# Patient Record
Sex: Male | Born: 1980
Health system: Southern US, Community
[De-identification: ages and names within clinical notes are randomized; demographics above are authoritative.]

## PROBLEM LIST (undated history)

## (undated) DIAGNOSIS — E7800 Pure hypercholesterolemia, unspecified: Secondary | ICD-10-CM

## (undated) DIAGNOSIS — D689 Coagulation defect, unspecified: Secondary | ICD-10-CM

## (undated) DIAGNOSIS — F1910 Other psychoactive substance abuse, uncomplicated: Secondary | ICD-10-CM

## (undated) DIAGNOSIS — D6851 Activated protein C resistance: Secondary | ICD-10-CM

## (undated) HISTORY — DX: Coagulation defect, unspecified: D68.9

## (undated) HISTORY — DX: Pure hypercholesterolemia, unspecified: E78.00

## (undated) HISTORY — DX: Other psychoactive substance abuse, uncomplicated: F19.10

## (undated) HISTORY — PX: MULTIPLE TOOTH EXTRACTIONS: SHX2053

---
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
## 2002-08-04 ENCOUNTER — Inpatient Hospital Stay (HOSPITAL_COMMUNITY): Admission: EM | Admit: 2002-08-04 | Discharge: 2002-08-10 | Payer: Self-pay | Admitting: Emergency Medicine

## 2002-08-08 ENCOUNTER — Encounter: Payer: Self-pay | Admitting: Endocrinology

## 2002-08-27 ENCOUNTER — Emergency Department (HOSPITAL_COMMUNITY): Admission: EM | Admit: 2002-08-27 | Discharge: 2002-08-27 | Payer: Self-pay | Admitting: *Deleted

## 2002-08-28 ENCOUNTER — Emergency Department (HOSPITAL_COMMUNITY): Admission: EM | Admit: 2002-08-28 | Discharge: 2002-08-28 | Payer: Self-pay | Admitting: *Deleted

## 2002-08-30 ENCOUNTER — Ambulatory Visit: Admission: RE | Admit: 2002-08-30 | Discharge: 2002-08-30 | Payer: Self-pay | Admitting: Oncology

## 2002-09-03 ENCOUNTER — Emergency Department (HOSPITAL_COMMUNITY): Admission: EM | Admit: 2002-09-03 | Discharge: 2002-09-03 | Payer: Self-pay | Admitting: Emergency Medicine

## 2002-09-04 ENCOUNTER — Emergency Department (HOSPITAL_COMMUNITY): Admission: EM | Admit: 2002-09-04 | Discharge: 2002-09-04 | Payer: Self-pay | Admitting: Emergency Medicine

## 2003-03-20 ENCOUNTER — Emergency Department (HOSPITAL_COMMUNITY): Admission: EM | Admit: 2003-03-20 | Discharge: 2003-03-20 | Payer: Self-pay | Admitting: Emergency Medicine

## 2003-04-03 ENCOUNTER — Emergency Department (HOSPITAL_COMMUNITY): Admission: EM | Admit: 2003-04-03 | Discharge: 2003-04-03 | Payer: Self-pay | Admitting: Emergency Medicine

## 2003-08-14 ENCOUNTER — Emergency Department (HOSPITAL_COMMUNITY): Admission: EM | Admit: 2003-08-14 | Discharge: 2003-08-15 | Payer: Self-pay

## 2003-09-14 ENCOUNTER — Emergency Department (HOSPITAL_COMMUNITY): Admission: EM | Admit: 2003-09-14 | Discharge: 2003-09-15 | Payer: Self-pay | Admitting: *Deleted

## 2003-09-14 ENCOUNTER — Encounter: Payer: Self-pay | Admitting: Emergency Medicine

## 2003-09-28 ENCOUNTER — Emergency Department (HOSPITAL_COMMUNITY): Admission: EM | Admit: 2003-09-28 | Discharge: 2003-09-28 | Payer: Self-pay | Admitting: Emergency Medicine

## 2005-05-18 ENCOUNTER — Inpatient Hospital Stay (HOSPITAL_COMMUNITY): Admission: EM | Admit: 2005-05-18 | Discharge: 2005-05-22 | Payer: Self-pay | Admitting: Emergency Medicine

## 2005-05-24 ENCOUNTER — Emergency Department (HOSPITAL_COMMUNITY): Admission: EM | Admit: 2005-05-24 | Discharge: 2005-05-24 | Payer: Self-pay | Admitting: Emergency Medicine

## 2005-05-26 ENCOUNTER — Ambulatory Visit: Payer: Self-pay | Admitting: *Deleted

## 2005-05-26 ENCOUNTER — Ambulatory Visit: Payer: Self-pay | Admitting: Family Medicine

## 2005-06-09 ENCOUNTER — Ambulatory Visit: Payer: Self-pay | Admitting: Oncology

## 2005-07-29 ENCOUNTER — Ambulatory Visit: Payer: Self-pay | Admitting: Oncology

## 2005-09-22 ENCOUNTER — Ambulatory Visit: Payer: Self-pay | Admitting: Oncology

## 2005-12-31 ENCOUNTER — Ambulatory Visit: Admission: RE | Admit: 2005-12-31 | Discharge: 2005-12-31 | Payer: Self-pay | Admitting: Oncology

## 2006-01-26 ENCOUNTER — Ambulatory Visit: Payer: Self-pay | Admitting: Oncology

## 2006-03-22 ENCOUNTER — Inpatient Hospital Stay (HOSPITAL_COMMUNITY): Admission: EM | Admit: 2006-03-22 | Discharge: 2006-03-23 | Payer: Self-pay | Admitting: *Deleted

## 2006-03-24 ENCOUNTER — Ambulatory Visit: Payer: Self-pay | Admitting: Oncology

## 2006-03-27 LAB — PROTIME-INR
INR: 2.1 (ref 2.00–3.50)
Protime: 17.9 Seconds — ABNORMAL HIGH (ref 10.6–13.4)

## 2006-03-30 LAB — PROTIME-INR
INR: 2.9 (ref 2.00–3.50)
Protime: 20.5 Seconds (ref 10.6–13.4)

## 2006-04-01 LAB — PROTIME-INR: Protime: 22.8 Seconds (ref 10.6–13.4)

## 2006-04-15 LAB — PROTIME-INR: INR: 4 (ref 2.00–3.50)

## 2006-04-22 LAB — PROTIME-INR

## 2006-05-07 ENCOUNTER — Ambulatory Visit (HOSPITAL_COMMUNITY): Admission: RE | Admit: 2006-05-07 | Discharge: 2006-05-07 | Payer: Self-pay | Admitting: Oncology

## 2006-05-07 LAB — PROTIME-INR
INR: 3.5 (ref 2.00–3.50)
Protime: 22.6 Seconds — ABNORMAL HIGH (ref 10.6–13.4)

## 2006-05-11 ENCOUNTER — Ambulatory Visit: Payer: Self-pay | Admitting: Oncology

## 2006-05-13 LAB — PROTIME-INR: Protime: 18.5 Seconds — ABNORMAL HIGH (ref 10.6–13.4)

## 2006-05-27 LAB — PROTIME-INR
INR: 1.8 — ABNORMAL LOW (ref 2.00–3.50)
Protime: 16.5 Seconds — ABNORMAL HIGH (ref 10.6–13.4)

## 2006-07-02 ENCOUNTER — Ambulatory Visit: Payer: Self-pay | Admitting: Oncology

## 2006-07-08 LAB — PROTIME-INR: Protime: 23 Seconds — ABNORMAL HIGH (ref 10.6–13.4)

## 2006-08-05 LAB — PROTIME-INR

## 2006-08-18 ENCOUNTER — Emergency Department (HOSPITAL_COMMUNITY): Admission: EM | Admit: 2006-08-18 | Discharge: 2006-08-19 | Payer: Self-pay | Admitting: Emergency Medicine

## 2006-08-31 ENCOUNTER — Ambulatory Visit: Payer: Self-pay | Admitting: Oncology

## 2006-09-02 LAB — PROTIME-INR: Protime: 30 Seconds — ABNORMAL HIGH (ref 10.6–13.4)

## 2006-10-26 ENCOUNTER — Ambulatory Visit: Payer: Self-pay | Admitting: Oncology

## 2006-12-20 ENCOUNTER — Ambulatory Visit: Payer: Self-pay | Admitting: Internal Medicine

## 2006-12-21 ENCOUNTER — Inpatient Hospital Stay (HOSPITAL_COMMUNITY): Admission: EM | Admit: 2006-12-21 | Discharge: 2006-12-22 | Payer: Self-pay | Admitting: Emergency Medicine

## 2006-12-25 ENCOUNTER — Ambulatory Visit: Payer: Self-pay | Admitting: Oncology

## 2007-01-16 ENCOUNTER — Emergency Department (HOSPITAL_COMMUNITY): Admission: EM | Admit: 2007-01-16 | Discharge: 2007-01-16 | Payer: Self-pay | Admitting: Emergency Medicine

## 2007-01-18 LAB — PROTIME-INR: Protime: 15.6 Seconds — ABNORMAL HIGH (ref 10.6–13.4)

## 2007-01-22 LAB — PROTIME-INR
INR: 1 — ABNORMAL LOW (ref 2.00–3.50)
Protime: 12 s (ref 10.6–13.4)

## 2007-02-16 ENCOUNTER — Ambulatory Visit: Payer: Self-pay | Admitting: Oncology

## 2007-02-22 LAB — PROTIME-INR
INR: 1.1 — ABNORMAL LOW (ref 2.00–3.50)
Protime: 13.2 Seconds (ref 10.6–13.4)

## 2007-04-13 ENCOUNTER — Ambulatory Visit: Payer: Self-pay | Admitting: Oncology

## 2007-06-02 ENCOUNTER — Ambulatory Visit: Payer: Self-pay | Admitting: Oncology

## 2007-06-02 LAB — PROTIME-INR

## 2007-07-13 ENCOUNTER — Ambulatory Visit: Payer: Self-pay | Admitting: Oncology

## 2007-09-27 ENCOUNTER — Ambulatory Visit: Payer: Self-pay | Admitting: Oncology

## 2007-11-14 ENCOUNTER — Ambulatory Visit: Payer: Self-pay | Admitting: Oncology

## 2008-03-16 ENCOUNTER — Ambulatory Visit: Payer: Self-pay | Admitting: Oncology

## 2008-04-18 LAB — PROTIME-INR: INR: 1.8 — ABNORMAL LOW (ref 2.00–3.50)

## 2008-05-10 ENCOUNTER — Ambulatory Visit: Payer: Self-pay | Admitting: Oncology

## 2008-07-06 ENCOUNTER — Ambulatory Visit: Payer: Self-pay | Admitting: Oncology

## 2008-08-21 ENCOUNTER — Ambulatory Visit: Payer: Self-pay | Admitting: Oncology

## 2008-08-21 LAB — PROTIME-INR
INR: 1.6 — ABNORMAL LOW (ref 2.00–3.50)
Protime: 19.2 Seconds — ABNORMAL HIGH (ref 10.6–13.4)

## 2008-10-10 ENCOUNTER — Ambulatory Visit: Payer: Self-pay | Admitting: Oncology

## 2008-10-12 LAB — PROTIME-INR
INR: 1.6 — ABNORMAL LOW (ref 2.00–3.50)
Protime: 19.2 Seconds — ABNORMAL HIGH (ref 10.6–13.4)

## 2008-11-28 ENCOUNTER — Ambulatory Visit: Payer: Self-pay | Admitting: Oncology

## 2008-11-28 LAB — PROTIME-INR: Protime: 18 Seconds — ABNORMAL HIGH (ref 10.6–13.4)

## 2009-02-19 ENCOUNTER — Ambulatory Visit: Payer: Self-pay | Admitting: Oncology

## 2009-02-19 LAB — PROTIME-INR: Protime: 45.6 Seconds — ABNORMAL HIGH (ref 10.6–13.4)

## 2009-05-04 ENCOUNTER — Ambulatory Visit: Payer: Self-pay | Admitting: Oncology

## 2009-06-07 LAB — CBC WITH DIFFERENTIAL/PLATELET
BASO%: 0.3 % (ref 0.0–2.0)
Basophils Absolute: 0 10*3/uL (ref 0.0–0.1)
HCT: 42.9 % (ref 38.4–49.9)
HGB: 14.8 g/dL (ref 13.0–17.1)
LYMPH%: 17.3 % (ref 14.0–49.0)
MCH: 31.2 pg (ref 27.2–33.4)
MCHC: 34.5 g/dL (ref 32.0–36.0)
MONO#: 0.6 10*3/uL (ref 0.1–0.9)
NEUT%: 72.9 % (ref 39.0–75.0)
Platelets: 248 10*3/uL (ref 140–400)
WBC: 7.7 10*3/uL (ref 4.0–10.3)

## 2009-06-07 LAB — PROTIME-INR

## 2009-06-18 ENCOUNTER — Ambulatory Visit: Payer: Self-pay | Admitting: Oncology

## 2009-06-18 LAB — PROTIME-INR
INR: 1 — ABNORMAL LOW (ref 2.00–3.50)
Protime: 12 Seconds (ref 10.6–13.4)

## 2009-07-03 LAB — PROTIME-INR: Protime: 22.8 Seconds — ABNORMAL HIGH (ref 10.6–13.4)

## 2009-07-18 ENCOUNTER — Ambulatory Visit: Payer: Self-pay | Admitting: Oncology

## 2010-02-25 ENCOUNTER — Ambulatory Visit: Payer: Self-pay | Admitting: Oncology

## 2010-02-25 LAB — PROTIME-INR: INR: 4.2 — ABNORMAL HIGH (ref 2.00–3.50)

## 2010-03-06 LAB — PROTIME-INR: INR: 1.9 — ABNORMAL LOW (ref 2.00–3.50)

## 2010-03-29 ENCOUNTER — Ambulatory Visit: Payer: Self-pay | Admitting: Oncology

## 2010-05-11 ENCOUNTER — Emergency Department (HOSPITAL_COMMUNITY): Admission: EM | Admit: 2010-05-11 | Discharge: 2010-05-11 | Payer: Self-pay | Admitting: Emergency Medicine

## 2010-07-16 ENCOUNTER — Ambulatory Visit: Payer: Self-pay | Admitting: Oncology

## 2010-09-29 ENCOUNTER — Ambulatory Visit: Payer: Self-pay | Admitting: Psychiatry

## 2010-09-29 ENCOUNTER — Emergency Department (HOSPITAL_COMMUNITY): Admission: EM | Admit: 2010-09-29 | Discharge: 2010-09-29 | Payer: Self-pay | Admitting: Emergency Medicine

## 2010-09-29 ENCOUNTER — Inpatient Hospital Stay (HOSPITAL_COMMUNITY): Admission: RE | Admit: 2010-09-29 | Discharge: 2010-10-04 | Payer: Self-pay | Admitting: Psychiatry

## 2010-10-07 ENCOUNTER — Ambulatory Visit: Payer: Self-pay | Admitting: Oncology

## 2010-10-07 LAB — PROTIME-INR: Protime: 26.4 Seconds — ABNORMAL HIGH (ref 10.6–13.4)

## 2011-01-12 ENCOUNTER — Encounter: Payer: Self-pay | Admitting: Specialist

## 2011-03-05 LAB — BASIC METABOLIC PANEL
BUN: 9 mg/dL (ref 6–23)
CO2: 29 mEq/L (ref 19–32)
GFR calc Af Amer: >60 mL/min (ref >60)
GFR calc non Af Amer: >60 mL/min (ref >60)
Potassium: 4 mEq/L (ref 3.5–5.1)
Sodium: 140 mEq/L (ref 135–145)

## 2011-03-05 LAB — RAPID URINE DRUG SCREEN, HOSP PERFORMED
Amphetamines: NOT DETECTED
Benzodiazepines: POSITIVE — AB
Opiates: POSITIVE — AB

## 2011-03-05 LAB — CBC
HCT: 44.2 % (ref 39.0–52.0)
MCHC: 34 g/dL (ref 30.0–36.0)
Platelets: 299 10*3/uL (ref 150–400)

## 2011-03-05 LAB — DIFFERENTIAL
Basophils Absolute: 0 10*3/uL (ref 0.0–0.1)
Lymphocytes Relative: 29 % (ref 12–46)
Monocytes Relative: 7 % (ref 3–12)
Neutrophils Relative %: 62 % (ref 43–77)

## 2011-03-06 LAB — PROTIME-INR
INR: 1.84 — ABNORMAL HIGH (ref 0.00–1.49)
INR: 2.17 — ABNORMAL HIGH (ref 0.00–1.49)
INR: 2.25 — ABNORMAL HIGH (ref 0.00–1.49)
Prothrombin Time: 22 seconds — ABNORMAL HIGH (ref 11.6–15.2)

## 2011-03-06 LAB — HIV ANTIBODY (ROUTINE TESTING W REFLEX): HIV: NONREACTIVE

## 2011-03-10 LAB — URINALYSIS, ROUTINE W REFLEX MICROSCOPIC
Bilirubin Urine: NEGATIVE
Nitrite: NEGATIVE
Specific Gravity, Urine: 1.014 (ref 1.005–1.030)
Urobilinogen, UA: 0.2 mg/dL (ref 0.0–1.0)
pH: 7 (ref 5.0–8.0)

## 2011-03-10 LAB — CBC
HCT: 40.4 % (ref 39.0–52.0)
MCV: 96.8 fL (ref 78.0–100.0)
WBC: 15.2 10*3/uL — ABNORMAL HIGH (ref 4.0–10.5)

## 2011-03-10 LAB — DIFFERENTIAL
Basophils Absolute: 0 10*3/uL (ref 0.0–0.1)
Eosinophils Absolute: 0.1 10*3/uL (ref 0.0–0.7)
Lymphs Abs: 1.4 10*3/uL (ref 0.7–4.0)
Monocytes Relative: 1 % — ABNORMAL LOW (ref 3–12)

## 2011-03-10 LAB — APTT: aPTT: 23 seconds — ABNORMAL LOW (ref 24–37)

## 2011-03-10 LAB — RAPID URINE DRUG SCREEN, HOSP PERFORMED: Barbiturates: NOT DETECTED

## 2011-03-10 LAB — PROTIME-INR: Prothrombin Time: 15.5 seconds — ABNORMAL HIGH (ref 11.6–15.2)

## 2011-05-09 NOTE — Discharge Summary (Signed)
Adam Landry, Adam Landry            ACCOUNT NO.:  [id number]   MEDICAL RECORD NO.:  [id number]          PATIENT TYPE:  INP   LOCATION:  5040                         FACILITY:  [facility]   PHYSICIAN:  C. Ulyess Mort, M.D.DATE OF BIRTH:  [birth year]   DATE OF ADMISSION:  12/20/2006  DATE OF DISCHARGE:  12/22/2006                               DISCHARGE SUMMARY   DISCHARGE DIAGNOSES:  1. Intentional Coumadin overdose.  2. Substance-induced mood disorder.  3. Alcohol abuse.  4. Cocaine abuse.  5. Tobacco abuse.  6. History of unprescribed Adderall abuse.  7. Factor V Leiden mutation.  8. History of deep venous thrombosis in 2003 and 2006.  9. History of pulmonary embolus in April 2007.   DISCHARGE MEDICATIONS:  Coumadin 10 mg p.o. daily, first dose on December 24, 2006.   DISPOSITION AND FOLLOWUP:  At the time of discharge, the patient was in  stable condition.  His INR was 3.8 and trending down.  He denied having  any suicidal or homicidal ideation and was felt to be safe for discharge  by psychiatry.  He was asked to contact Dr. Darnelle Catalan in order to resume  normal monitoring of his Coumadin.  He will also be contacted by the  [facility] in order to have a hospital followup  scheduled in the next 1-2 weeks.  Finally, he was provided with a card  for [facility] and advised to contact them for further  psychiatric and substance abuse treatment.   PROCEDURES PERFORMED:  None.   CONSULTATIONS:  Antonietta Breach, M.D., psychiatry.   BRIEF ADMITTING HISTORY AND PHYSICAL:  The patient is a 30 year old  gentleman with a history of multiple DVTs and PE secondary to a factor V  Leiden mutation, who is on chronic Coumadin therapy.  He was brought to  the emergency room by the [facility] Department for  disorderly conduct.  While in custody, he reported that he had taken an  unknown amount of Coumadin that evening.  The patient reports that, the  day prior to admission, he had been drinking quite heavily with friends.  Upon coming to his home, he took an unknown amount of Coumadin.  He is  unsure of exactly why he did this, but cannot exclude that it was an  attempt to hurt himself.  He denied having any active bleeding, although  he endorsed having one episode of melanotic stools several months ago,  as well as intermittent blood-streaked stools since that time.  He also  complained of transient chest pain which was not in any way similar to  his pain when he had his pulmonary embolus earlier this year.   PHYSICAL EXAMINATION:  VITAL SIGNS:  Temperature 98.6, blood pressure  131/72, pulse 74, respirations 20, oxygen saturation 98% on room air.  GENERAL:  The patient is a well-developed, well-nourished gentleman in  no acute distress.  HEENT:  Pupils are mildly constricted and sluggish, but otherwise  reactive to light and accommodation.  The patient has moist mucous  membranes without any oropharyngeal erythema or lesions.  RESPIRATORY:  Lungs are clear  to auscultation bilaterally.  CARDIOVASCULAR:  The patient has a regular rate and rhythm without  murmurs, rubs, or gallops.  ABDOMEN:  The patient's abdomen is soft, nontender, nondistended, with  normoactive bowel sounds.  RECTAL:  He has normal rectal tone and an empty vault.  EXTREMITIES:  No clubbing, cyanosis, or edema is noted.  The patient has  a sizable ecchymosis on his right hand where he said he had punched a  wall on the evening prior to admission.  SKIN:  No petechiae or purpura are noted.  His skin is otherwise without  rashes or lesions with the exception of the bruise on his right hand.  NEUROLOGIC:  Cranial nerves II-XII are intact.  Cerebellar function is  normal.  The patient has normal gait and balance.  He has 5/5 strength  in his upper and lower extremities bilaterally.  He has 2+ deep tendon  reflexes throughout.  He has normal fine touch sensation in his  upper  and lower extremities bilaterally.  PSYCH:  The patient is alert and oriented x3.  He denies having any  active suicidal or homicidal ideation.  He also denies auditory or  visual hallucinations; however, he endorses having a depressed mood,  anhedonia, disturbed sleep, decreased concentration, decreased energy,  occasional flight of ideas, and a history of decreased need for sleep  and reckless behavior in the past.   ADMISSION LABS:  White blood cell count 7.4, hemoglobin 14.9, platelets  239, ANC 4.5, MCV 95.3.  Sodium 142, potassium 4, chloride 112,  bicarbonate 20, BUN 16, creatinine 1.1, glucose 93, anion gap 10.  Total  bilirubin 0.5, alkaline phosphatase 52, AST 31, ALT 32, total protein  6.3, albumin 3.9, calcium 9.  PT 37.9, INR 3.6, PTT 60.  Serum  acetaminophen less than 10, serum salicylate less than 4, serum alcohol  136, urine drug screen positive for benzodiazepines, cocaine, and THC.   EKG:  Normal sinus rhythm with a rate of 83.  Intervals are all within  normal limits.  The patient has alternate criteria for LVH, with a QRS  amplitude greater than 10 mm in lead 1; otherwise, his EKG is normal  without any acute ST-segment changes.   HOSPITAL COURSE:  1. Coumadin overdose.  Because the patient had taken an unknown but      significant quantity of Coumadin on the day prior to admission, he      was admitted for monitoring.  His initial INR was already mildly      supratherapeutic at 3.6 and subsequently trended upwards, reaching      a maximum of 5.6 on the day after admission.  However, from there      it began trending downward and had reached a level of 3.8 by the      time of discharge.  He was instructed to restart his Coumadin 2      days after discharge on January 3.  Additionally, he was asked to      contact Dr. Darrall Dears office as soon as possible so that his INRs     can continue to be monitored and appropriate adjustments to his      Coumadin  made.  At no time during his hospitalization did the      patient have any active bleeding.  Additionally, his stools were      found to be fecal occult blood negative x2 and his hemoglobin      remained stable.  2. Substance-induced  mood disorder:  Based on the patient's drug      overdose and report of both depressed and manic mood over the      preceding weeks, he was admitted to the floor under suicide      precautions.  Dr. Jeanie Sewer of psychiatry evaluated the patient and      felt that his mental state was most consistent with a substance-      induced mood disorder.  He was not felt to be a threat to himself      or others and suicide precautions were discontinued.  The patient      stated at the time of admission that he had been taking Adderall      for several years, though he was never prescribed this.  He felt      that once he no longer had a supply of this, his mood became      depressed and he believed that he had attention deficit      hyperactivity disorder.  He thus requested that we provide him with      a prescription for Adderall, though we did not do this.  He was      given a card for [facility], where he can be      evaluated further and receive treatment for his psychiatric issues.  3. Polysubstance abuse:  As noted above, the patient was found to have      a urine drug screen positive for multiple substances.  He also      endorsed using these regularly for several years.  He was monitored      on the CIWA protocol and given thiamine and folate while in the      hospital.  At no time did he exhibit any signs or symptoms of acute      alcohol withdrawal.  He was provided information for alcohol and      drug services (ADS) and was strongly encouraged to obtain help for      his addictions.  4. Factor V Leiden mutation and recurrent DVTs and PE.  Because the      patient's INR was supratherapeutic and he had taken a large dose of      Coumadin,  further anticoagulation was not needed during this      hospitalization.  It was felt that it would be safe for the patient      to restart Coumadin 2 days after discharge based on his last INR.      It was stressed to him again that he follow up with Dr. Darnelle Catalan,      with whom he wished to continue dosing of his Coumadin, as soon as      possible.  A PT and INR will also be checked when he returns for      followup in the outpatient clinic in 1-2 weeks.   DISCHARGE LABS:  White blood cell count 7.2, hemoglobin 16.1, platelets  237,000.  PT 40, INR 3.8.  Sodium 138, potassium 3.8, chloride 107,  bicarbonate 24, BUN 16, creatinine 1, glucose 92, calcium 9.4.  HIV  negative.  Fecal occult blood negative x2.   DISCHARGE VITALS:  Temperature 99, blood pressure 129/85, pulse 71,  respirations 16, oxygen saturation 99% on room air.      Yvonne Kendall, M.D.  Electronically Signed     C. Ulyess Mort, M.D.  Electronically Signed    CE/MEDQ  D:  12/23/2006  T:  12/23/2006  Job:  [id number]   cc:   Dr. Lilian Kapur, M.D.

## 2011-05-09 NOTE — Consult Note (Signed)
NAMEMarland Kitchen  Adam Landry, Adam Landry NO.:  [id number]   MEDICAL RECORD NO.:  [id number]           PATIENT TYPE:   LOCATION:                                 FACILITY:   PHYSICIAN:  Antonietta Breach, M.D.  DATE OF BIRTH:  07-26-1981   DATE OF CONSULTATION:  12/21/2006  DATE OF DISCHARGE:                                 CONSULTATION   REQUESTING PHYSICIAN:  Ulyess Mort, M.D.   REASON FOR CONSULTATION:  Overdose.   HISTORY OF PRESENT ILLNESS:  Adam Landry is a 30 year old male admitted  to the [facility] on December 20, 2006 after a Coumadin overdose.   The patient does have a factor V Leiden mutation and is on chronic  Coumadin therapy.  He came to the emergency room under sheriff custody  for disorderly conduct. The patient was highly intoxicated on alcohol.  He did take an overdose of Coumadin.   He now reports that he has no suicidal thoughts or thoughts of harming  others.  He has no hallucinations or delusions.  His mood and interests  are within normal limits.  He has no hopelessness.  He does acknowledge  a severely excessive drinking of alcohol.   PAST PSYCHIATRIC HISTORY:  No history of mania.  No history of major  depression.  No history of suicide attempts.   FAMILY PSYCHIATRIC HISTORY:  None.   SOCIAL HISTORY:  The patient is single.  He has no children.  He does  have a history of alcohol abuse.  He has used cocaine in the past.  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  [employer].   PAST MEDICAL HISTORY:  As above. Her factor V Leiden mutation, history  of deep venous thrombosis in 2003 and 2006, history of pulmonary embolus  April 2007.   MEDICATIONS:  The MAR is reviewed.  The patient is not on any  psychotropic medication.   LABORATORY DATA:  WBC 6.5, hemoglobin 15.8, platelet count 241.   Comprehensive metabolic panel is unremarkable.  The SGOT is normal at  31, SGPT is normal at 32.  Tylenol was negative.   Alcohol was 136 still  after arriving at the emergency room.  Aspirin was negative.   REVIEW OF SYSTEMS:  CONSTITUTIONAL:  Afebrile.  No weight loss.  HEAD:  No trauma.  EYES:  No visual changes.  EARS:  No hearing impairment  NOSE:  No rhinorrhea.  THROAT:  No sore throat.  NEUROLOGIC:  Unremarkable.  PSYCHIATRIC:  As above. CARDIOVASCULAR:  No chest pain,  palpitations.  RESPIRATORY:  No coughing or wheezing.  GASTROINTESTINAL:  Unremarkable.  GENITOURINARY:  Unremarkable.  SKIN:  Unremarkable in the  interview.  METABOLIC:  Unremarkable.  MUSCULOSKELETAL:  No deformities.  HEMATOLOGIC LYMPHATIC:  As above.   PHYSICAL EXAMINATION:  VITAL SIGNS:  Temperature 97, pulse 68,  respiration 20, blood pressure 125/76, O2 saturation on room air 96%.  GENERAL APPEARANCE:  The patient is a young male appearing his  chronologic age sitting in his hospital bed in no apparent distress.  His eye contact is good.  He does not have any abnormal involuntary  movements or abnormal habitus.  He is well-groomed.   The patient's attention span is within normal limits.  His concentration  is normal.  He is oriented to all spheres.  His memory is intact to  immediate, recent and remote.  Fund of knowledge and intelligence are  within normal limits.  His speech involves normal rate and prosody.  There is no dysarthria.   Thought process is logical, coherent, goal-directed.  No looseness of  associations, calculation and abstraction ability are within normal  limits.  Thought content; no thoughts of harming himself.  No thoughts  of harming others. No delusions, no hallucinations. Insight is partial.  Judgment is intact.   ASSESSMENT:  AXIS I:  Rule out polysubstance dependence.          Adjustment disorder with mixed disturbance of emotions and  conduct.  AXIS II:  Deferred.  AXIS III:  See general medical problems.  AXIS IV:  General medical, legal.  AXIS V:  55.   The patient is no longer at risk to  harm himself or others after  recovering from his intoxicated state.   He agrees to call emergency services immediately for any thoughts of  harming himself, thoughts of harming others or distress.   The undersigned did recommend residential chemical dependency  rehabilitation in an inpatient substance rehab tract however, the  patient declined and he is no longer committable after recovering from  his intoxicated state.   The patient will attend outpatient ADS and 12-step groups.      Antonietta Breach, M.D.  Electronically Signed     JW/MEDQ  D:  06/28/2007  T:  06/29/2007  Job:  [id number]

## 2011-05-09 NOTE — H&P (Signed)
NAMEEMONI, YANG NO.:  [id number]   MEDICAL RECORD NO.:  [id number]          PATIENT TYPE:  INP   LOCATION:  1411                         FACILITY:  [facility]   PHYSICIAN:  Lonia Blood, M.D.       DATE OF BIRTH:  [birth year]   DATE OF ADMISSION:  03/22/2006  DATE OF DISCHARGE:                                HISTORY & PHYSICAL   CHIEF COMPLAINT:  Chest pain.   HISTORY OF PRESENT ILLNESS:  Mr. Evetts is a 30 year old man with history  of recurrent DVT's, factor 5 Leiden positive, who presented to [facility] emergency room this morning with new onset of severe right  sided chest pain. He denies any sweating, radiation, or shortness of breath.  He also denies any recent lower extremity swelling.   PAST MEDICAL HISTORY:  Right deep vein thrombosis in 2003, left deep vein  thrombosis in 2006. Also factor 5 Leiden positive. The patient used to take  Coumadin but he stopped taking Coumadin about 6 months ago.   FAMILY HISTORY:  Mother alive with hx of superficial venous thrombosis.  Father alive and healthy. Sister alive and healthy.   SOCIAL HISTORY:  The patient smokes a pack of cigarettes a day. Drinks beer.  Rarely uses alcohol. He says that he used to go to [employer] but now he  is making too much money to go there anymore.   ALLERGIES:  NO KNOWN DRUG ALLERGIES.   MEDICATIONS:  None.   REVIEW OF SYSTEMS:  All systems are negative.   PHYSICAL EXAMINATION:  VITAL SIGNS:  Temperature 98.7, pulse 88, respiratory  rate 24, blood pressure 133/80. Saturation 98% on room air.  GENERAL:  A well developed, well nourished man in no acute distress, sitting  on the stretcher.  HEENT:  Pupils are equal, round, and reactive to light and accommodation.  Extraocular muscles intact. Head is normocephalic, atraumatic.  NECK:  Supple. Without JVD. No carotid bruits.  CHEST:  Clear to auscultation bilaterally without wheezes, rhonchi, or  crackles.  HEART:  Regular rate and rhythm. No murmur, rub, or gallop.  ABDOMEN:  Soft, nontender, and nondistended. Bowel sounds present.  EXTREMITIES:  Have +1 bilateral edema, right greater than left. Per the  patient, this is chronic in nature.  SKIN:  Warm and dry without any suspicious rashes.  NEUROLOGIC:  Examination non-focal.   LABORATORY DATA:  On admission sodium 137, potassium 4.2, chloride 107,  bicarb 22, BUN 18, creatinine 1, glucose 124. Liver function studies are  within normal limits. White blood cell count 13.1. Hemoglobin 15.2. Platelet  count is 213,000.   CT scan of the chest shows a solitary right middle lobe branch pulmonary  artery embolus.   ASSESSMENT/PLAN:  1.  Pulmonary embolism. Most likely cause is the hypercoagulable statue due      to the genetic defect. Mr. Kolbeck will be started on subcutaneous      Lovenox and Coumadin. He will be educated about the importance of taking      Coumadin for the rest of his life. He will also be educated  about self-      administering the Lovenox and following up at the [facility] for      daily PT and INR's.  2.  Tobacco abuse. Mr. Monette will have counseling and he will be offered      the nicotine patch.      Lonia Blood, M.D.  Electronically Signed     SL/MEDQ  D:  03/22/2006  T:  03/23/2006  Job:  [id number]

## 2011-10-06 ENCOUNTER — Other Ambulatory Visit: Payer: Self-pay | Admitting: Oncology

## 2011-10-06 ENCOUNTER — Encounter (HOSPITAL_BASED_OUTPATIENT_CLINIC_OR_DEPARTMENT_OTHER): Payer: Self-pay | Admitting: Oncology

## 2011-10-06 DIAGNOSIS — Z86718 Personal history of other venous thrombosis and embolism: Secondary | ICD-10-CM

## 2011-10-06 DIAGNOSIS — Z5181 Encounter for therapeutic drug level monitoring: Secondary | ICD-10-CM

## 2011-10-06 DIAGNOSIS — Z7901 Long term (current) use of anticoagulants: Secondary | ICD-10-CM

## 2011-10-06 LAB — PROTIME-INR

## 2011-11-17 ENCOUNTER — Encounter: Payer: Self-pay | Admitting: *Deleted

## 2011-11-17 ENCOUNTER — Other Ambulatory Visit: Payer: Self-pay | Admitting: *Deleted

## 2011-11-17 DIAGNOSIS — D689 Coagulation defect, unspecified: Secondary | ICD-10-CM

## 2011-11-17 MED ORDER — WARFARIN SODIUM 5 MG PO TABS: 5.0000 mg | ORAL_TABLET | ORAL | Status: DC

## 2011-12-15 ENCOUNTER — Other Ambulatory Visit: Payer: Self-pay | Admitting: *Deleted

## 2011-12-15 DIAGNOSIS — D689 Coagulation defect, unspecified: Secondary | ICD-10-CM

## 2011-12-15 MED ORDER — WARFARIN SODIUM 5 MG PO TABS: ORAL_TABLET | ORAL | Status: DC

## 2012-02-02 ENCOUNTER — Encounter: Payer: Self-pay | Admitting: *Deleted

## 2012-02-02 NOTE — Progress Notes (Signed)
Refill authorization request for coumadin received via [employer] e[employer] for coumadin.  Request denied with reason patient needs to contact prescriber office.  Last PT/INR was Oct. 15, 2012 with no further appointments.

## 2012-02-11 NOTE — Progress Notes (Signed)
REFILL REQUEST RECEIVED VIA [employer] DENIED WITH REASON PATIENT NEEDS TO CONTACT PRESCRIBER.

## 2012-02-13 ENCOUNTER — Other Ambulatory Visit: Payer: Self-pay | Admitting: *Deleted

## 2012-02-13 NOTE — Telephone Encounter (Signed)
Pt called to this RN to state the pharmacy has not received refill ok for his coumadin. He is out of medication at this time.  This RN informed Rocky Link need to obtain lab on regular basis per his diagnosis and dosing of coumadin.  Note Rocky Link is not driving at this time.  Plan at present is for refill x 1 of coumadin which pt will resume today.  Lab will be drawn Thursday 2/28 per transportation arrangements.

## 2012-02-23 ENCOUNTER — Other Ambulatory Visit (HOSPITAL_BASED_OUTPATIENT_CLINIC_OR_DEPARTMENT_OTHER): Payer: Self-pay | Admitting: Lab

## 2012-02-23 ENCOUNTER — Other Ambulatory Visit: Payer: Self-pay | Admitting: *Deleted

## 2012-02-23 DIAGNOSIS — D6859 Other primary thrombophilia: Secondary | ICD-10-CM

## 2012-02-23 LAB — PROTIME-INR: INR: 1.6 — ABNORMAL LOW (ref 2.00–3.50)

## 2012-03-11 ENCOUNTER — Other Ambulatory Visit: Payer: Self-pay | Admitting: *Deleted

## 2012-03-11 DIAGNOSIS — D689 Coagulation defect, unspecified: Secondary | ICD-10-CM

## 2012-03-11 MED ORDER — WARFARIN SODIUM 5 MG PO TABS: ORAL_TABLET | ORAL | Status: DC

## 2012-04-12 ENCOUNTER — Other Ambulatory Visit: Payer: Self-pay | Admitting: *Deleted

## 2012-04-12 DIAGNOSIS — D689 Coagulation defect, unspecified: Secondary | ICD-10-CM

## 2012-04-12 MED ORDER — WARFARIN SODIUM 5 MG PO TABS: ORAL_TABLET | ORAL | Status: DC

## 2012-04-23 ENCOUNTER — Other Ambulatory Visit: Payer: Self-pay | Admitting: *Deleted

## 2012-04-23 DIAGNOSIS — D689 Coagulation defect, unspecified: Secondary | ICD-10-CM

## 2012-04-23 MED ORDER — WARFARIN SODIUM 5 MG PO TABS: ORAL_TABLET | ORAL | Status: DC

## 2012-05-28 ENCOUNTER — Other Ambulatory Visit: Payer: Self-pay | Admitting: *Deleted

## 2012-05-28 DIAGNOSIS — D6859 Other primary thrombophilia: Secondary | ICD-10-CM

## 2012-06-03 ENCOUNTER — Other Ambulatory Visit (HOSPITAL_BASED_OUTPATIENT_CLINIC_OR_DEPARTMENT_OTHER): Payer: Self-pay | Admitting: Lab

## 2012-06-03 DIAGNOSIS — Z86718 Personal history of other venous thrombosis and embolism: Secondary | ICD-10-CM

## 2012-06-03 DIAGNOSIS — Z5181 Encounter for therapeutic drug level monitoring: Secondary | ICD-10-CM

## 2012-06-03 DIAGNOSIS — Z7901 Long term (current) use of anticoagulants: Secondary | ICD-10-CM

## 2012-06-03 DIAGNOSIS — D6859 Other primary thrombophilia: Secondary | ICD-10-CM

## 2012-06-03 LAB — CBC WITH DIFFERENTIAL/PLATELET
Basophils Absolute: 0 10*3/uL (ref 0.0–0.1)
Eosinophils Absolute: 0.2 10*3/uL (ref 0.0–0.5)
HGB: 13.9 g/dL (ref 13.0–17.1)
MCV: 88.6 fL (ref 79.3–98.0)
MONO%: 9.7 % (ref 0.0–14.0)
NEUT#: 4.4 10*3/uL (ref 1.5–6.5)
Platelets: 202 10*3/uL (ref 140–400)
RDW: 13.2 % (ref 11.0–14.6)

## 2012-06-03 LAB — PROTIME-INR: Protime: 19.2 Seconds — ABNORMAL HIGH (ref 10.6–13.4)

## 2012-06-25 ENCOUNTER — Other Ambulatory Visit: Payer: Self-pay | Admitting: *Deleted

## 2012-06-25 DIAGNOSIS — D6859 Other primary thrombophilia: Secondary | ICD-10-CM

## 2012-06-28 ENCOUNTER — Other Ambulatory Visit (HOSPITAL_BASED_OUTPATIENT_CLINIC_OR_DEPARTMENT_OTHER): Payer: Self-pay | Admitting: Lab

## 2012-06-28 DIAGNOSIS — D6859 Other primary thrombophilia: Secondary | ICD-10-CM

## 2012-06-28 LAB — PROTIME-INR
INR: 1.1 — ABNORMAL LOW (ref 2.00–3.50)
Protime: 13.2 Seconds (ref 10.6–13.4)

## 2012-07-01 ENCOUNTER — Telehealth: Payer: Self-pay

## 2012-07-01 NOTE — Telephone Encounter (Signed)
Called pt and left message for pt to call office back.  Need to verify his coumadin dose, per Dr. Darnelle Catalan- if pt has not been taking 5 mg regularly, he should continue his current dose (should be 5 mg daily) and recheck in 1 week.  If he has been taking it regularly, he should increase to 10 mg and recheck in 2 weeks.

## 2012-09-05 ENCOUNTER — Other Ambulatory Visit: Payer: Self-pay | Admitting: Oncology

## 2012-09-06 ENCOUNTER — Other Ambulatory Visit: Payer: Self-pay | Admitting: Oncology

## 2012-09-06 DIAGNOSIS — D689 Coagulation defect, unspecified: Secondary | ICD-10-CM

## 2012-09-10 ENCOUNTER — Other Ambulatory Visit: Payer: Self-pay | Admitting: Lab

## 2012-09-23 ENCOUNTER — Other Ambulatory Visit: Payer: Self-pay | Admitting: Oncology

## 2012-09-23 DIAGNOSIS — D6859 Other primary thrombophilia: Secondary | ICD-10-CM

## 2012-09-27 ENCOUNTER — Other Ambulatory Visit: Payer: Self-pay | Admitting: *Deleted

## 2012-10-08 ENCOUNTER — Other Ambulatory Visit: Payer: Self-pay | Admitting: Lab

## 2012-10-08 ENCOUNTER — Ambulatory Visit: Payer: Self-pay | Admitting: Physician Assistant

## 2012-10-08 NOTE — Progress Notes (Signed)
[facility] today.  Letter mailed to patient.  

## 2012-11-04 ENCOUNTER — Other Ambulatory Visit (HOSPITAL_BASED_OUTPATIENT_CLINIC_OR_DEPARTMENT_OTHER): Payer: Self-pay | Admitting: Lab

## 2012-11-04 DIAGNOSIS — D689 Coagulation defect, unspecified: Secondary | ICD-10-CM

## 2012-11-04 LAB — CBC WITH DIFFERENTIAL/PLATELET
BASO%: 0.4 % (ref 0.0–2.0)
EOS%: 4.5 % (ref 0.0–7.0)
Eosinophils Absolute: 0.4 10*3/uL (ref 0.0–0.5)
MCH: 32.1 pg (ref 27.2–33.4)
MCHC: 35.2 g/dL (ref 32.0–36.0)
MCV: 91.1 fL (ref 79.3–98.0)
MONO%: 9.6 % (ref 0.0–14.0)
NEUT#: 5.3 10*3/uL (ref 1.5–6.5)
RBC: 5.05 10*6/uL (ref 4.20–5.82)
RDW: 13.3 % (ref 11.0–14.6)
nRBC: 0 % (ref 0–0)

## 2012-11-04 LAB — PROTIME-INR: Protime: 15.6 Seconds — ABNORMAL HIGH (ref 10.6–13.4)

## 2012-11-05 ENCOUNTER — Other Ambulatory Visit: Payer: Self-pay | Admitting: Lab

## 2012-11-25 ENCOUNTER — Other Ambulatory Visit: Payer: Self-pay | Admitting: Lab

## 2012-12-03 ENCOUNTER — Other Ambulatory Visit: Payer: Self-pay | Admitting: Lab

## 2012-12-31 ENCOUNTER — Other Ambulatory Visit: Payer: Self-pay

## 2013-01-04 ENCOUNTER — Other Ambulatory Visit: Payer: Self-pay | Admitting: Oncology

## 2013-01-04 DIAGNOSIS — D6859 Other primary thrombophilia: Secondary | ICD-10-CM

## 2013-01-05 ENCOUNTER — Encounter: Payer: Self-pay | Admitting: Pharmacist

## 2013-01-05 ENCOUNTER — Other Ambulatory Visit: Payer: Self-pay | Admitting: *Deleted

## 2013-01-05 DIAGNOSIS — D6859 Other primary thrombophilia: Secondary | ICD-10-CM

## 2013-01-05 NOTE — Progress Notes (Signed)
New consult to [facility] Coumadin clinic by Dr. Darnelle Catalan Indication: Factor V Leiden w/ h/o recurrent DVT Goal INR = 2-3.5 Length of anticoag: indefinite Risk Factors: non-compliant.  Drinks EtOH.   Current dose: 5 mg/day Needs to be seen w/in 1 month.  Have sched 1st [facility] appt for 01/28/13 (lab already sched).  I called pt to inform him he needs to stay after lab that day to be seen in [facility]. Marily Lente, Pharm.D.

## 2013-01-28 ENCOUNTER — Other Ambulatory Visit (HOSPITAL_BASED_OUTPATIENT_CLINIC_OR_DEPARTMENT_OTHER): Payer: Self-pay

## 2013-01-28 ENCOUNTER — Ambulatory Visit (HOSPITAL_BASED_OUTPATIENT_CLINIC_OR_DEPARTMENT_OTHER): Payer: Self-pay | Admitting: Pharmacist

## 2013-01-28 DIAGNOSIS — I82409 Acute embolism and thrombosis of unspecified deep veins of unspecified lower extremity: Secondary | ICD-10-CM

## 2013-01-28 DIAGNOSIS — D6851 Activated protein C resistance: Secondary | ICD-10-CM | POA: Insufficient documentation

## 2013-01-28 DIAGNOSIS — D6859 Other primary thrombophilia: Secondary | ICD-10-CM

## 2013-01-28 DIAGNOSIS — D689 Coagulation defect, unspecified: Secondary | ICD-10-CM

## 2013-01-28 HISTORY — DX: Acute embolism and thrombosis of unspecified deep veins of unspecified lower extremity: I82.409

## 2013-01-28 LAB — CBC WITH DIFFERENTIAL/PLATELET
Eosinophils Absolute: 0.2 10*3/uL (ref 0.0–0.5)
HCT: 45.9 % (ref 38.4–49.9)
LYMPH%: 21.6 % (ref 14.0–49.0)
MONO#: 0.6 10*3/uL (ref 0.1–0.9)
NEUT#: 4.9 10*3/uL (ref 1.5–6.5)
NEUT%: 67.1 % (ref 39.0–75.0)
Platelets: 243 10*3/uL (ref 140–400)
WBC: 7.3 10*3/uL (ref 4.0–10.3)
lymph#: 1.6 10*3/uL (ref 0.9–3.3)

## 2013-01-28 LAB — PROTIME-INR: Protime: 12 Seconds (ref 10.6–13.4)

## 2013-01-28 LAB — POCT INR: INR: 1

## 2013-01-28 MED ORDER — WARFARIN SODIUM 5 MG PO TABS: 10.0000 mg | ORAL_TABLET | Freq: Every day | ORAL | Status: DC

## 2013-01-28 NOTE — Progress Notes (Signed)
Pt presents to Coumadin Clinic for his 1st visit INR = 1 Pt reports that he went out of town over the weekend & forgot to take his Warfarin.  He restarted his Warfarin 10 mg/day on Wed (2/5) & he took his dose this AM.  He takes his dose in the AM. Pt states that when he travels, he often forgets his Warfarin & substitutes ASA.  I reminded him that the two are not equal agents.  He expressed understanding. He was remodeling a store over the weekend & he is sore this week.  Subsequently, he has used Ibuprofen 400 mg PO daily for pain. His diet consists of 7-8 servings of vit K foods each week.  He eats 1-2 tossed salads/week. He drinks a 6 pack of beer each week.  He seldom drinks enough EtOH to get "falling-down-drunk".  I reminded him that EtOH can increase INR.  He should never have multiple drinks over a short time span to the point of intoxication while on anticoagulation. Pt admits that he is non-compliant w/ his dosing of Warfarin.  When I reviewed the risk of stopping anticoagulation in his case, he expressed understanding & stated that he has "learned from his past mistakes." A full discussion of the nature of anticoagulants has been carried out.  A benefit risk analysis has been presented to the patient, so that he understands the justification for choosing anticoagulation at this time. The need for frequent and regular monitoring, precise dosage adjustment and compliance is stressed.  Side effects of potential bleeding are discussed.  The patient should avoid any OTC items containing aspirin or ibuprofen, and should avoid great swings in general diet.  Pt tells me that the dose of Coumadin 10 mg/day has maintained his INR at goal in the past so I will restart that dose & have him return in 2 weeks for INR check. I refilled his Warfarin w/ no refills so he will hopefully return for Coumadin clinic visits. Marily Lente, Pharm.D.

## 2013-02-10 ENCOUNTER — Other Ambulatory Visit: Payer: Self-pay

## 2013-02-10 ENCOUNTER — Ambulatory Visit (HOSPITAL_BASED_OUTPATIENT_CLINIC_OR_DEPARTMENT_OTHER): Payer: Self-pay | Admitting: Pharmacist

## 2013-02-10 DIAGNOSIS — D6851 Activated protein C resistance: Secondary | ICD-10-CM

## 2013-02-10 DIAGNOSIS — D6859 Other primary thrombophilia: Secondary | ICD-10-CM

## 2013-02-10 DIAGNOSIS — I82409 Acute embolism and thrombosis of unspecified deep veins of unspecified lower extremity: Secondary | ICD-10-CM

## 2013-02-10 LAB — POCT INR: INR: 3.1

## 2013-02-10 LAB — PROTIME-INR: Protime: 37.2 Seconds — ABNORMAL HIGH (ref 10.6–13.4)

## 2013-02-10 NOTE — Patient Instructions (Addendum)
Continue coumadin 10mg  daily. Recheck INR with your next scheduled lab appointment on 37/14; 10am for lab and 10:15am for coumadin clinic.  Remember that Ibuprofen can increase your INR and bleeding risk. If you continue Ibuprofen on a frequent basis for an extended length of time, you may irritate your stomach. It is important to take this medication with food.  Monitor for excessive bruising or any bleeding.

## 2013-02-10 NOTE — Progress Notes (Signed)
INR within goal today on 10mg  daily. Pt stated that he has been taking Ibuprofen for "quite some time" to help with the pain of an old shoulder injury. 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 We discussed that this medication is important to take with food. He is aware that it can effect his INR and may increase his risk for bleeding and bruising.   Coumadin 10 mg/day Return 02/25/13 at 10 am for lab; 10:15 am for Coumadin clinic.

## 2013-02-18 ENCOUNTER — Ambulatory Visit: Payer: Self-pay | Admitting: Lab

## 2013-02-18 ENCOUNTER — Ambulatory Visit: Payer: Self-pay

## 2013-02-21 ENCOUNTER — Ambulatory Visit (HOSPITAL_BASED_OUTPATIENT_CLINIC_OR_DEPARTMENT_OTHER): Payer: Self-pay | Admitting: Lab

## 2013-02-21 ENCOUNTER — Ambulatory Visit (HOSPITAL_BASED_OUTPATIENT_CLINIC_OR_DEPARTMENT_OTHER): Payer: Self-pay | Admitting: Pharmacist

## 2013-02-21 DIAGNOSIS — D6859 Other primary thrombophilia: Secondary | ICD-10-CM

## 2013-02-21 DIAGNOSIS — I82409 Acute embolism and thrombosis of unspecified deep veins of unspecified lower extremity: Secondary | ICD-10-CM

## 2013-02-21 DIAGNOSIS — I82403 Acute embolism and thrombosis of unspecified deep veins of lower extremity, bilateral: Secondary | ICD-10-CM

## 2013-02-21 DIAGNOSIS — D6851 Activated protein C resistance: Secondary | ICD-10-CM

## 2013-02-21 LAB — POCT INR: INR: 2.1

## 2013-02-21 NOTE — Patient Instructions (Addendum)
INR at goal. [employer]ie [employer] job!   No changes   Continue same dose of 10 mg daily. Return in 2 weeks on 03/08/13 at 1030am

## 2013-02-21 NOTE — Progress Notes (Signed)
INR at goal. No missed doses or bleeding. Patient does have small bruising on arms and legs from job. These bruises are looking better and getting smaller per the patient. No changes at this time. Continue same dose of 10 mg daily and return in two weeks on 03/08/13 at 1030am for lab and 1045 for coumadin clinic

## 2013-02-25 ENCOUNTER — Other Ambulatory Visit: Payer: Self-pay

## 2013-02-25 ENCOUNTER — Ambulatory Visit: Payer: Self-pay

## 2013-03-08 ENCOUNTER — Other Ambulatory Visit: Payer: Self-pay | Admitting: Oncology

## 2013-03-08 ENCOUNTER — Other Ambulatory Visit: Payer: Self-pay | Admitting: Lab

## 2013-03-08 ENCOUNTER — Ambulatory Visit: Payer: Self-pay

## 2013-03-08 DIAGNOSIS — D6859 Other primary thrombophilia: Secondary | ICD-10-CM

## 2013-03-08 DIAGNOSIS — I82409 Acute embolism and thrombosis of unspecified deep veins of unspecified lower extremity: Secondary | ICD-10-CM

## 2013-03-15 ENCOUNTER — Telehealth: Payer: Self-pay | Admitting: Pharmacist

## 2013-03-25 ENCOUNTER — Other Ambulatory Visit: Payer: Self-pay | Admitting: Lab

## 2013-04-01 ENCOUNTER — Other Ambulatory Visit (HOSPITAL_BASED_OUTPATIENT_CLINIC_OR_DEPARTMENT_OTHER): Payer: Self-pay | Admitting: Lab

## 2013-04-01 DIAGNOSIS — D6859 Other primary thrombophilia: Secondary | ICD-10-CM

## 2013-04-01 DIAGNOSIS — D689 Coagulation defect, unspecified: Secondary | ICD-10-CM

## 2013-04-01 LAB — CBC WITH DIFFERENTIAL/PLATELET
EOS%: 2.6 % (ref 0.0–7.0)
Eosinophils Absolute: 0.2 10*3/uL (ref 0.0–0.5)
LYMPH%: 20.6 % (ref 14.0–49.0)
MCH: 32.4 pg (ref 27.2–33.4)
MCHC: 34.6 g/dL (ref 32.0–36.0)
MCV: 93.6 fL (ref 79.3–98.0)
MONO%: 7.9 % (ref 0.0–14.0)
NEUT#: 6.2 10*3/uL (ref 1.5–6.5)
Platelets: 202 10*3/uL (ref 140–400)
RBC: 4.66 10*6/uL (ref 4.20–5.82)

## 2013-04-01 LAB — PROTIME-INR: Protime: 37.2 Seconds — ABNORMAL HIGH (ref 10.6–13.4)

## 2013-04-22 ENCOUNTER — Other Ambulatory Visit: Payer: Self-pay

## 2013-04-25 ENCOUNTER — Telehealth: Payer: Self-pay | Admitting: Pharmacist

## 2013-04-25 NOTE — Telephone Encounter (Signed)
Pt last seen 02/21/13 in our Coumadin clinic.

## 2013-04-28 ENCOUNTER — Other Ambulatory Visit (HOSPITAL_BASED_OUTPATIENT_CLINIC_OR_DEPARTMENT_OTHER): Payer: Self-pay

## 2013-04-28 ENCOUNTER — Ambulatory Visit: Payer: Self-pay | Admitting: Pharmacist

## 2013-04-28 DIAGNOSIS — D6859 Other primary thrombophilia: Secondary | ICD-10-CM

## 2013-04-28 DIAGNOSIS — D689 Coagulation defect, unspecified: Secondary | ICD-10-CM

## 2013-04-28 DIAGNOSIS — I82403 Acute embolism and thrombosis of unspecified deep veins of lower extremity, bilateral: Secondary | ICD-10-CM

## 2013-04-28 DIAGNOSIS — D6851 Activated protein C resistance: Secondary | ICD-10-CM

## 2013-04-28 LAB — CBC WITH DIFFERENTIAL/PLATELET
Basophils Absolute: 0 10*3/uL (ref 0.0–0.1)
Eosinophils Absolute: 0.2 10*3/uL (ref 0.0–0.5)
LYMPH%: 33.5 % (ref 14.0–49.0)
MCH: 32.4 pg (ref 27.2–33.4)
MCV: 94.5 fL (ref 79.3–98.0)
MONO%: 11 % (ref 0.0–14.0)
NEUT#: 2.6 10*3/uL (ref 1.5–6.5)
Platelets: 221 10*3/uL (ref 140–400)
RBC: 5.06 10*6/uL (ref 4.20–5.82)
nRBC: 0 % (ref 0–0)

## 2013-04-28 LAB — POCT INR

## 2013-04-28 NOTE — Patient Instructions (Signed)
Continue Coumadin 10mg  daily.   Return 05/24/13 at 1pm for lab; 1:15pm for Coumadin clinic.

## 2013-04-28 NOTE — Progress Notes (Signed)
INR within goal. No problems to report regarding anticoagulation. Pt would like to have his INR checked at [facility] in [place] and have the results faxed/phoned here. It is much closer for him. Will call facility and try to set this up for his next INR check scheduled on 05/24/13. Continue Coumadin 10mg  daily.   Return 05/24/13 at 1pm for lab; 1:15pm for Coumadin clinic. Will cancel his 05/24/13 appointment here at [facility] if [facility] is able to check his INR and fax the results to [place].

## 2013-04-29 ENCOUNTER — Telehealth: Payer: Self-pay | Admitting: Pharmacist

## 2013-04-29 ENCOUNTER — Telehealth: Payer: Self-pay | Admitting: *Deleted

## 2013-04-29 NOTE — Telephone Encounter (Signed)
Per Anola Gurney to cancel the lab scheduled for 05/20/13...td

## 2013-04-29 NOTE — Telephone Encounter (Signed)
Pt asked if he could have his labs including PT/INR done at [facility] in [place] and have the lab results faxed or phoned to [place]. This is much closer for him than driving to [place]. I called the practice and they would be able to draw the labs only if he were a patient of one of the physicians at the practice. Bernette Redbird is not a current patient. They are not accepting new patients at this time. They would not be able to draw his labs and send the results to [facility]. Left message on pt phone regarding above information. We will keep his appointments on 05/24/13 at 1pm for f/u INR here at [facility]. We will see pt 05/24/13.

## 2013-05-20 ENCOUNTER — Other Ambulatory Visit: Payer: Self-pay | Admitting: Lab

## 2013-05-24 ENCOUNTER — Ambulatory Visit: Payer: Self-pay

## 2013-05-24 ENCOUNTER — Telehealth: Payer: Self-pay | Admitting: Pharmacist

## 2013-05-24 ENCOUNTER — Other Ambulatory Visit: Payer: Self-pay | Admitting: Lab

## 2013-05-24 NOTE — Telephone Encounter (Signed)
Mr Adam Landry in coumadin clinic today.  Left VM to call and reschedule.

## 2013-06-15 ENCOUNTER — Telehealth: Payer: Self-pay | Admitting: Pharmacist

## 2013-06-15 NOTE — Telephone Encounter (Signed)
Pt called to r/s a missed lab/Coumadin clinic appointment from the beginning of June. He has been rescheduled to 06/22/13 - lab @ 10:45am and coumadin clinic at 11am.

## 2013-06-17 ENCOUNTER — Other Ambulatory Visit: Payer: Self-pay

## 2013-06-22 ENCOUNTER — Other Ambulatory Visit (HOSPITAL_BASED_OUTPATIENT_CLINIC_OR_DEPARTMENT_OTHER): Payer: BC Managed Care – PPO

## 2013-06-22 ENCOUNTER — Ambulatory Visit (HOSPITAL_BASED_OUTPATIENT_CLINIC_OR_DEPARTMENT_OTHER): Payer: BC Managed Care – PPO | Admitting: Pharmacist

## 2013-06-22 DIAGNOSIS — D6859 Other primary thrombophilia: Secondary | ICD-10-CM

## 2013-06-22 DIAGNOSIS — I82409 Acute embolism and thrombosis of unspecified deep veins of unspecified lower extremity: Secondary | ICD-10-CM

## 2013-06-22 DIAGNOSIS — I82403 Acute embolism and thrombosis of unspecified deep veins of lower extremity, bilateral: Secondary | ICD-10-CM

## 2013-06-22 DIAGNOSIS — D689 Coagulation defect, unspecified: Secondary | ICD-10-CM

## 2013-06-22 DIAGNOSIS — D6851 Activated protein C resistance: Secondary | ICD-10-CM

## 2013-06-22 LAB — PROTIME-INR: INR: 2.9 (ref 2.00–3.50)

## 2013-06-22 LAB — CBC WITH DIFFERENTIAL/PLATELET
Basophils Absolute: 0 10*3/uL (ref 0.0–0.1)
HCT: 46.6 % (ref 38.4–49.9)
HGB: 16.2 g/dL (ref 13.0–17.1)
MONO#: 0.4 10*3/uL (ref 0.1–0.9)
NEUT%: 61.9 % (ref 39.0–75.0)
WBC: 5.4 10*3/uL (ref 4.0–10.3)
lymph#: 1.4 10*3/uL (ref 0.9–3.3)

## 2013-06-22 NOTE — Progress Notes (Signed)
INR continues to be at goal of 2-3.5.  Adam Landry has no complaints and no changes in medications.  Will continue coumadin 10mg  daily and check PT/INR in 2 months.

## 2013-07-15 ENCOUNTER — Other Ambulatory Visit: Payer: Self-pay

## 2013-08-23 ENCOUNTER — Encounter (HOSPITAL_COMMUNITY): Payer: Self-pay | Admitting: Emergency Medicine

## 2013-08-23 ENCOUNTER — Emergency Department (HOSPITAL_COMMUNITY)
Admission: EM | Admit: 2013-08-23 | Discharge: 2013-08-23 | Disposition: A | Discharge status: Home / Self Care | Payer: BC Managed Care – PPO | Attending: Emergency Medicine | Admitting: Emergency Medicine

## 2013-08-23 DIAGNOSIS — Y9389 Activity, other specified: Secondary | ICD-10-CM | POA: Insufficient documentation

## 2013-08-23 DIAGNOSIS — Z23 Encounter for immunization: Secondary | ICD-10-CM | POA: Insufficient documentation

## 2013-08-23 DIAGNOSIS — W260XXA Contact with knife, initial encounter: Secondary | ICD-10-CM | POA: Insufficient documentation

## 2013-08-23 DIAGNOSIS — Z203 Contact with and (suspected) exposure to rabies: Secondary | ICD-10-CM

## 2013-08-23 DIAGNOSIS — Y929 Unspecified place or not applicable: Secondary | ICD-10-CM | POA: Insufficient documentation

## 2013-08-23 DIAGNOSIS — IMO0002 Reserved for concepts with insufficient information to code with codable children: Secondary | ICD-10-CM

## 2013-08-23 DIAGNOSIS — D6859 Other primary thrombophilia: Secondary | ICD-10-CM | POA: Insufficient documentation

## 2013-08-23 DIAGNOSIS — F17200 Nicotine dependence, unspecified, uncomplicated: Secondary | ICD-10-CM | POA: Insufficient documentation

## 2013-08-23 DIAGNOSIS — Z7901 Long term (current) use of anticoagulants: Secondary | ICD-10-CM | POA: Insufficient documentation

## 2013-08-23 DIAGNOSIS — S61209A Unspecified open wound of unspecified finger without damage to nail, initial encounter: Secondary | ICD-10-CM | POA: Insufficient documentation

## 2013-08-23 HISTORY — DX: Activated protein C resistance: D68.51

## 2013-08-23 MED ORDER — RABIES IMMUNE GLOBULIN 150 UNIT/ML IM INJ
20.0000 [IU]/kg | INJECTION | Freq: Once | INTRAMUSCULAR | Status: AC
  Administered 2013-08-23: 1725 [IU]
  Filled 2013-08-23: qty 11.5

## 2013-08-23 MED ORDER — IBUPROFEN 600 MG PO TABS: 600.0000 mg | ORAL_TABLET | Freq: Four times a day (QID) | ORAL | Status: DC | PRN

## 2013-08-23 MED ORDER — TETANUS-DIPHTH-ACELL PERTUSSIS 5-2.5-18.5 LF-MCG/0.5 IM SUSP
0.5000 mL | Freq: Once | INTRAMUSCULAR | Status: AC
  Administered 2013-08-23: 0.5 mL via INTRAMUSCULAR
  Filled 2013-08-23: qty 0.5

## 2013-08-23 MED ORDER — RABIES VACCINE, PCEC IM SUSR
1.0000 mL | Freq: Once | INTRAMUSCULAR | Status: AC
  Administered 2013-08-23: 1 mL via INTRAMUSCULAR
  Filled 2013-08-23: qty 1

## 2013-08-23 NOTE — ED Provider Notes (Signed)
CSN: [id number]     Arrival date & time 08/23/13  0220 History   First MD Initiated Contact with Patient 08/23/13 0403     Chief Complaint  Patient presents with  . Extremity Laceration   (Consider location/radiation/quality/duration/timing/severity/associated sxs/prior Treatment) HPI This is a 32 year old male who presents with a laceration to his left fourth digit. The patient states that he was trying to strip out why her with a knife when he cut himself. He is concerned because he used the knife 2 days ago to kill a fox he had run over. Patient has a history of factor V Leiden and is on Coumadin. He denies any other injuries. He is unsure when his last tetanus shot was. Past Medical History  Diagnosis Date  . Factor 5 Leiden mutation, heterozygous    History reviewed. No pertinent past surgical history. History reviewed. No pertinent family history. History  Substance Use Topics  . Smoking status: Current Every Day Smoker  . Smokeless tobacco: Not on file  . Alcohol Use: Yes    Review of Systems  Constitutional: Negative.  Negative for fever.  Respiratory: Negative.  Negative for shortness of breath.   Cardiovascular: Negative.  Negative for chest pain.  Gastrointestinal: Negative.  Negative for abdominal pain.  Genitourinary: Negative.   Skin: Positive for wound.  All other systems reviewed and are negative.    Allergies  Review of patient's allergies indicates no known allergies.  Home Medications   Current Outpatient Rx  Name  Route  Sig  Dispense  Refill  . warfarin (COUMADIN) 5 MG tablet      TAKE 2 TABLETS (10MG  TOTAL) BY MOUTH ONCE A DAY AS INSTRUCTED   60 tablet   4   . ibuprofen (ADVIL,MOTRIN) 600 MG tablet   Oral   Take 1 tablet (600 mg total) by mouth every 6 (six) hours as needed for pain.   30 tablet   0   . Ibuprofen 200 MG CAPS   Oral   Take 400 mg by mouth 3 (three) times daily as needed. Pt has been using Ibuprofen Liqui-Gels.           BP 104/48  Pulse 62  Temp(Src) 98.8 F (37.1 C) (Oral)  Resp 18  Ht 6' (1.829 m)  Wt 190 lb (86.183 kg)  BMI 25.76 kg/m2  SpO2 95% Physical Exam  Nursing note and vitals reviewed. Constitutional: He is oriented to person, place, and time. He appears well-developed and well-nourished.  HENT:  Head: Normocephalic and atraumatic.  Cardiovascular: Normal rate, regular rhythm and normal heart sounds.   No murmur heard. Pulmonary/Chest: Effort normal and breath sounds normal. No respiratory distress. He has no wheezes.  Abdominal: Soft. Bowel sounds are normal. There is no tenderness.  Musculoskeletal: He exhibits no edema.  Focused examination of the left hand reveals a 3 cm V-shaped laceration over the medial aspects of the fourth digit. Bleeding is controlled. Flexion and extension is preserved. Patient is neurovascularly intact.  Lymphadenopathy:    He has no cervical adenopathy.  Neurological: He is alert and oriented to person, place, and time.  Skin: Skin is warm and dry.  Psychiatric: He has a normal mood and affect.    ED Course  Procedures (including critical care time) Labs Review Labs Reviewed - No data to display Imaging Review No results found. LACERATION REPAIR Performed by: Ross Marcus, F Authorized by: Ross Marcus, F Consent: Verbal consent obtained. Risks and benefits: risks, benefits and alternatives were discussed  Consent given by: patient Patient identity confirmed: provided demographic data Prepped and Draped in normal sterile fashion Wound explored  Laceration Location: finger  Laceration Length: 3 cm  No Foreign Bodies seen or palpated  Anesthesia: local infiltration  Local anesthetic: lidocaine 1% w/o epinephrine, digital block  Anesthetic total: 3 ml  Irrigation method: syringe Amount of cleaning: standard  Skin closure: 5-0 nylon   Number of sutures: 3  Technique: interrupted  Patient tolerance: Patient tolerated the  procedure well with no immediate complications. MDM   1. Laceration   2. Rabies exposure    This is a 32 year old male who presents with a laceration to his left 4th finger.  Patient is concerned as the knife that cut him was used to kill a fox 2 days ago. There are no clear guidelines on rabies prophylaxis in this situation. However, rabies is known to be carried by foxes and given the unknown status of this animal, the patient will be given rabies immunoglobulin and vaccination. He will followup at urgent care in the ER for repeat vaccination at 3, 7, and 14 days.  Patient's tetanus is also updated. Laceration was repaired.  After history, exam, and medical workup I feel the patient has been appropriately medically screened and is safe for discharge home. Pertinent diagnoses were discussed with the patient. Patient was given return precautions.    Shon Baton, MD 08/23/13 [phone]

## 2013-08-23 NOTE — ED Notes (Signed)
Pt arrived to Ed with a complaint of a finger laceration.  Pt was attempting to cut tape with a knife and cut his left index finger.  Laceration is approx. 3 cm long on the proximal finger.  Pt also concerned that he had hit a fox 2 days ago and dispatched the fox with the same knife and failed to clean knife post dispatch.  Pt also has a history of blood clots.

## 2013-08-23 NOTE — ED Notes (Signed)
Wound cleaned and dressing placed.

## 2013-08-24 ENCOUNTER — Telehealth: Payer: Self-pay | Admitting: Pharmacist

## 2013-08-24 ENCOUNTER — Other Ambulatory Visit: Payer: Self-pay | Admitting: Oncology

## 2013-08-24 ENCOUNTER — Ambulatory Visit: Payer: BC Managed Care – PPO

## 2013-08-24 ENCOUNTER — Other Ambulatory Visit: Payer: BC Managed Care – PPO | Admitting: Lab

## 2013-08-24 NOTE — Telephone Encounter (Signed)
FTKA today. Called and left VM for patient to call back to reschedule lab and coumadin clinic appointment.  Christell Faith, PharmD

## 2013-08-25 ENCOUNTER — Other Ambulatory Visit: Payer: Self-pay | Admitting: *Deleted

## 2013-08-25 DIAGNOSIS — D6859 Other primary thrombophilia: Secondary | ICD-10-CM

## 2013-08-25 DIAGNOSIS — I82409 Acute embolism and thrombosis of unspecified deep veins of unspecified lower extremity: Secondary | ICD-10-CM

## 2013-08-25 MED ORDER — WARFARIN SODIUM 5 MG PO TABS: 5.0000 mg | ORAL_TABLET | Freq: Every day | ORAL | Status: DC

## 2013-08-25 NOTE — Telephone Encounter (Signed)
Refill request received from pt's pharmacy brought to MD for review by refill RN- due to pt has not kept recent appointments with coumadin clinic.  Per MD - refill x 1 given with request to reschedule appt for lab and coumadin clinic.  Refill given and request for appt sent to scheduling.

## 2013-08-28 ENCOUNTER — Telehealth: Payer: Self-pay | Admitting: Oncology

## 2013-08-28 NOTE — Telephone Encounter (Signed)
S/w pt re lb/CC appt for 9/8 @ 8am. D/t per pt. Per 9/4 pof r/s ASAP and document communication - chronic no show.

## 2013-08-29 ENCOUNTER — Ambulatory Visit (HOSPITAL_BASED_OUTPATIENT_CLINIC_OR_DEPARTMENT_OTHER): Payer: BC Managed Care – PPO | Admitting: Pharmacist

## 2013-08-29 ENCOUNTER — Other Ambulatory Visit (HOSPITAL_BASED_OUTPATIENT_CLINIC_OR_DEPARTMENT_OTHER): Payer: BC Managed Care – PPO | Admitting: Lab

## 2013-08-29 DIAGNOSIS — I82409 Acute embolism and thrombosis of unspecified deep veins of unspecified lower extremity: Secondary | ICD-10-CM

## 2013-08-29 DIAGNOSIS — D6851 Activated protein C resistance: Secondary | ICD-10-CM

## 2013-08-29 DIAGNOSIS — D689 Coagulation defect, unspecified: Secondary | ICD-10-CM

## 2013-08-29 DIAGNOSIS — D6859 Other primary thrombophilia: Secondary | ICD-10-CM

## 2013-08-29 DIAGNOSIS — I82403 Acute embolism and thrombosis of unspecified deep veins of lower extremity, bilateral: Secondary | ICD-10-CM

## 2013-08-29 LAB — CBC WITH DIFFERENTIAL/PLATELET
BASO%: 0.3 % (ref 0.0–2.0)
EOS%: 1.7 % (ref 0.0–7.0)
HCT: 46.7 % (ref 38.4–49.9)
LYMPH%: 18.5 % (ref 14.0–49.0)
MCH: 32 pg (ref 27.2–33.4)
MCHC: 34 g/dL (ref 32.0–36.0)
NEUT%: 73.3 % (ref 39.0–75.0)
RBC: 4.97 10*6/uL (ref 4.20–5.82)
lymph#: 1.3 10*3/uL (ref 0.9–3.3)
nRBC: 0 % (ref 0–0)

## 2013-08-29 LAB — PROTIME-INR: INR: 1.7 — ABNORMAL LOW (ref 2.00–3.50)

## 2013-08-29 NOTE — Patient Instructions (Addendum)
INR below goal No changes Continue Coumadin 10 mg daily.  Remember to take your coumadin! Return 10/24/13 at 10:00 for lab; 10:15 for Coumadin clinic.

## 2013-08-29 NOTE — Progress Notes (Signed)
INR below goal today. Pt has been stable on 10 mg daily for the past few months. Pt reports missing a "few doses in the last 2 weeks". Pt also reports cutting his index finger over Labor day weekend with a knife resulting in several stitches but that there was no excess bleeding and it is currently healing well with the stiches being ready to take out soon. Adam Landry reports no new medications.   I reinforced and instructed the patient on the importance of remembering to take his coumadin daily. He agreed and voiced understanding. Will make no changes to his regimen. Plan to continue Coumadin 10 mg daily. Return 10/24/13 at 10:00 for lab; 10:15 for Coumadin clinic.  **Of note, patient voiced he is considering moving to [place] at the end of the month and asked for assistance in finding a lab in the area for his INR draws. He stated this plan is "still up in the air" at this time.** Will follow up

## 2013-10-06 ENCOUNTER — Other Ambulatory Visit: Payer: Self-pay | Admitting: Oncology

## 2013-10-06 DIAGNOSIS — I82409 Acute embolism and thrombosis of unspecified deep veins of unspecified lower extremity: Secondary | ICD-10-CM

## 2013-10-06 DIAGNOSIS — D6859 Other primary thrombophilia: Secondary | ICD-10-CM

## 2013-10-10 ENCOUNTER — Telehealth: Payer: Self-pay | Admitting: *Deleted

## 2013-10-10 NOTE — Telephone Encounter (Signed)
Patient called in requesting to send referral to   [facility]  [address] [place], [place] [address] [phone]   Physician Referral [phone]   - See more at: [email or web address] Request sent to Medical Records.  Will refill Warfarin in patient unable to obtain appt with local [facility].

## 2013-10-13 ENCOUNTER — Telehealth: Payer: Self-pay | Admitting: Oncology

## 2013-10-13 NOTE — Telephone Encounter (Signed)
Called pt left vm to return call about the referral to [place]

## 2013-10-21 ENCOUNTER — Telehealth: Payer: Self-pay | Admitting: Pharmacist

## 2013-10-21 DIAGNOSIS — I82409 Acute embolism and thrombosis of unspecified deep veins of unspecified lower extremity: Secondary | ICD-10-CM

## 2013-10-21 DIAGNOSIS — D6859 Other primary thrombophilia: Secondary | ICD-10-CM

## 2013-10-21 MED ORDER — WARFARIN SODIUM 5 MG PO TABS: 10.0000 mg | ORAL_TABLET | Freq: Every day | ORAL | Status: DC

## 2013-10-21 NOTE — Telephone Encounter (Signed)
Pt left vm on Coumadin clinic phone 10/20/13 at 3:58 pm stating he needed refill of Coumadin Pt has moved to [place], [place].  His contact # is [phone] He is having trouble getting established at a Coumadin clinic in [facility].  A referral was made (see RN Telephone Note from 10/10/13). I refilled his Coumadin x 1 month only. I have archived pt from our Coumadin clinic. Ebony Hail, Pharm.D., CPP 10/21/2013@11 :03 AM

## 2013-10-24 ENCOUNTER — Telehealth: Payer: Self-pay | Admitting: *Deleted

## 2013-10-24 ENCOUNTER — Other Ambulatory Visit: Payer: BC Managed Care – PPO | Admitting: Lab

## 2013-10-24 ENCOUNTER — Ambulatory Visit: Payer: BC Managed Care – PPO

## 2013-10-24 NOTE — Telephone Encounter (Signed)
This RN left message with Laurel Dimmer in HIM per message from [facility] stating they are still awaiting pt's records per need for referral and consult to their coumadin clinic.  Noted referral for above made on 10/10/2013 by AM/RN per inbox message.

## 2013-10-24 NOTE — Telephone Encounter (Signed)
Message received from [facility]

## 2013-11-24 ENCOUNTER — Telehealth: Payer: Self-pay | Admitting: Pharmacist

## 2013-11-24 ENCOUNTER — Other Ambulatory Visit: Payer: Self-pay | Admitting: Pharmacist

## 2013-11-24 DIAGNOSIS — I82409 Acute embolism and thrombosis of unspecified deep veins of unspecified lower extremity: Secondary | ICD-10-CM

## 2013-11-24 DIAGNOSIS — D6859 Other primary thrombophilia: Secondary | ICD-10-CM

## 2013-11-24 MED ORDER — WARFARIN SODIUM 5 MG PO TABS: 10.0000 mg | ORAL_TABLET | Freq: Every day | ORAL | Status: DC

## 2013-11-24 NOTE — Telephone Encounter (Signed)
Tried calling patient to notify him a RX for coumadin 10mg  #60 was called in to [facility] pharmacy [phone]) in [place] per his request.  He got a one month supply and he is establishing a relationship with a coumadin clinic in [place], [place].

## 2013-12-30 ENCOUNTER — Telehealth: Payer: Self-pay | Admitting: Pharmacist

## 2013-12-30 DIAGNOSIS — D6859 Other primary thrombophilia: Secondary | ICD-10-CM

## 2013-12-30 DIAGNOSIS — I82409 Acute embolism and thrombosis of unspecified deep veins of unspecified lower extremity: Secondary | ICD-10-CM

## 2013-12-30 MED ORDER — WARFARIN SODIUM 5 MG PO TABS: 10.0000 mg | ORAL_TABLET | Freq: Every day | ORAL | Status: DC

## 2013-12-30 NOTE — Telephone Encounter (Addendum)
Pt is in final stages of transferring care to [place]. Pt called to request another refill on his coumadin tablets. He takes 10mg  daily. He uses the 5mg  tablets and he takes 2 tablets daily. Will call in a 14 day supply - no refills to [facility] in [place], [place] (Phone:  [phone]). Pt is not having any unusual bleeding or bruising noted. No s/s of clotting noted. Pt thinks that he may have been taking a double dose of his coumadin since he picked up his last refill (20mg  daily instead of 10mg  daily). Stressed the importance of having his INR checked as soon as possible (at ED or urgent care) and to report to ED for any unusual bruising and bleeding.

## 2014-01-23 ENCOUNTER — Telehealth: Payer: Self-pay | Admitting: Pharmacist

## 2014-01-24 NOTE — Telephone Encounter (Signed)
Pt is in final stages of transferring care to [place].  Pt called to request another refill on his coumadin tablets.  Pt has been unable to have his INR checked as he has been very busy working 2 jobs He takes 10mg  daily. He uses the 5mg  tablets and he takes 2 tablets daily.  Will call in another 14 day supply - no refills to [facility] in [place], [place] (Phone: [phone]).  Pt is not having any unusual bleeding or bruising noted.  No s/s of clotting noted.  Again, Stressed the importance of having his INR checked as soon as possible (at ED or urgent care) and to set up care with a coumadin clinic  and to report to ED for any unusual bruising and bleeding. Pt also states he may be making a trip back to [place] in the near future and will let us know his plans

## 2014-02-27 ENCOUNTER — Telehealth: Payer: Self-pay | Admitting: Pharmacist

## 2014-02-28 NOTE — Telephone Encounter (Signed)
Bernette RedbirdKenny has been unable to establish primary care in [place]. Ok per Dr. Darnelle CatalanMagrinat to refill Coumadin for Bernette RedbirdKenny x 18month. Pt takes 10mg  daily. He uses the 5mg  tablets. [facility] to [facility] [place], [place] (Phone: [phone]). Coumadin 5mg  tablets, #60tablets, Take 2 tablets daily. No refills. Pt is not having any unusual bleeding or bruising noted.  No s/s of clotting noted.  Again, Stressed the importance of having his INR checked as soon as possible (at ED or urgent care)  and to set up care with a coumadin clinic  and to report to ED for any unusual bruising and bleeding.

## 2014-04-06 ENCOUNTER — Telehealth: Payer: Self-pay | Admitting: Pharmacist

## 2014-04-06 DIAGNOSIS — I82409 Acute embolism and thrombosis of unspecified deep veins of unspecified lower extremity: Secondary | ICD-10-CM

## 2014-04-06 DIAGNOSIS — D6859 Other primary thrombophilia: Secondary | ICD-10-CM

## 2014-04-06 MED ORDER — WARFARIN SODIUM 5 MG PO TABS: 10.0000 mg | ORAL_TABLET | Freq: Every day | ORAL | Status: DC

## 2014-04-06 NOTE — Telephone Encounter (Signed)
[facility] requesting refill on his Coumadin. [place] [facility] & states he has MD appt there on 04/18/14. I refilled his Coumadin - 5 mg strength; 10 mg PO daily.  14 day supply w/ no refills. Ebony HailGinna Jurline Folger, Pharm.D., CPP 04/06/2014@8 :36 AM

## 2014-04-25 ENCOUNTER — Telehealth: Payer: Self-pay | Admitting: *Deleted

## 2014-04-25 NOTE — Telephone Encounter (Signed)
Adam Landry left message stating he will be in town on Thursday 5/7 and would like to come in to the coumadin clinic.  Requested and appointment for 10 or 11am.  He will bring referral form for MD in [place] for transfer of care.  Return call number given as [phone].  This RN scheduled pt and returned call to above number- obtained message stating "this number has been disconnected"  Noted given number different then incoming number per VM- per VM number is [phone].  This RN then attempted this number and obtained a VM stating " you have reached Adam Landry and Adam Landry "  Message per appt date and time with this RN's name and phone number given for return call if needed.

## 2014-04-27 ENCOUNTER — Telehealth: Payer: Self-pay | Admitting: Pharmacist

## 2014-04-27 ENCOUNTER — Ambulatory Visit (HOSPITAL_BASED_OUTPATIENT_CLINIC_OR_DEPARTMENT_OTHER): Payer: BC Managed Care – PPO

## 2014-04-27 ENCOUNTER — Ambulatory Visit (HOSPITAL_BASED_OUTPATIENT_CLINIC_OR_DEPARTMENT_OTHER): Payer: BC Managed Care – PPO | Admitting: Pharmacist

## 2014-04-27 DIAGNOSIS — D6859 Other primary thrombophilia: Secondary | ICD-10-CM

## 2014-04-27 DIAGNOSIS — D6851 Activated protein C resistance: Secondary | ICD-10-CM

## 2014-04-27 DIAGNOSIS — I82409 Acute embolism and thrombosis of unspecified deep veins of unspecified lower extremity: Secondary | ICD-10-CM

## 2014-04-27 LAB — POCT INR: INR: 1.3

## 2014-04-27 LAB — PROTIME-INR
INR: 1.3 — ABNORMAL LOW (ref 2.00–3.50)
PROTIME: 15.6 s — AB (ref 10.6–13.4)

## 2014-04-27 NOTE — Progress Notes (Signed)
INR = 1.3 Pt on Coumadin 10 mg daily. No missed doses recently.  He has not taken today's dose. Pt is a past Coumadin clinic pt here--He is here today for INR check simply because he is in [place] for the weekend.  His friend "Adam Landry" is here with him today but she stayed back in the lobby to "make flowers" in the art area. We've not seen Adam Landry in our clinic in quite a while since [place] [facility], although we have had multiple calls from him requesting refills on his Coumadin.  He has not yet established care w/ a [place] [facility] [place] despite our recommendations to him to do so. Pt states today that he got his Coumadin refilled (at "[facility] by Dr. Waunita SchoonerGertrude Cotiaux w/ [facility] (ph # [phone] or [phone]) without having his INR checked at that physician's office.  Pt plans to see Dr. Bing Reeotiaux as his PCP and according to pt, that physician has a "walk-in" coumadin clinic. However today he brought in an "Anticoagulation Clinic Referral Form" for [facility] (ph# [phone]; fax #[phone]).  I am awaiting a call back from them to explain how their program works.  Pt says he got the form by just going to [facility] and asking to speak w/ a RN there named [name]. I see a past telephone note from Adam AppleAmy Mitchell, RN at [facility] w/ numbers for [facility] (ph# [phone]).  I s/w a receptionist there who informed me she was unaware that there was an [facility]on[facility]icoag clinic at [facility].   There are no hematologists at [facility] according to the receptionist.  She gave me 2 options they give their pts for referral to hematologist: [place]: ph# [phone] and Dr. Ivar BuryMitchell [place]: ph# [phone].  These are private MD offices, not affiliated w/ [facility]. I have s/w Adam PaulsVal Dodd, RN for Dr. Darnelle CatalanMagrinat over the phone & have given her the above information.  She will attempt to coordinate so that Adam Landry can finally get in with a hematologist  rather that being seen at his PCP office for management of his Coumadin. Among many obvious complex issues, pts INR is low today.  I have advised him to take Coumadin 15 mg today then go back to 10 mg daily. He needs to have his INR checked again within 1 week [place] [facility] [place]-- either at the PCP office or w/ one of the hematologists mentioned above or at this [facility]. Adam PaulsVal Dodd, RN will be in touch w/ pt over the next few business days.  We will see pt in our clinic only when necessary. Adam Landry, Pharm.D., CPP 04/27/2014@12 :52 PM

## 2014-04-27 NOTE — Telephone Encounter (Signed)
Pt brought in an "Anticoagulation Clinic Referral Form" from [facility] that pt stated needed completing & signed by Dr. Darnelle CatalanMagrinat. I called the [facility] & left a voicemail requesting a call-back so that I could understand how their clinic works. Pt stated they may not see him there since he has not been seen by Dr. Darnelle CatalanMagrinat here since 10/08/12 (actually saw Zollie ScaleAmy Berry, NP that day). [place] now [place] [facility] [place] [facility] & needs to be established there w/ hematologist.  I need to assure that there is a hematologist at this clinic before we refer him there.  Once I have heard back from the [facility] clinic, then I will s/w Dr. Darnelle CatalanMagrinat & get his signature on the form. Ebony HailGinna Stefan Markarian, Pharm.D., CPP 04/27/2014@11 :44 AM

## 2014-05-08 ENCOUNTER — Encounter: Payer: Self-pay | Admitting: Pharmacist

## 2014-05-08 NOTE — Progress Notes (Signed)
Spoke with Adam Landry regarding patients anticoag referral to clinic with [facility] in [place]. She called clinic and Adam Landry was familiar with patient and his request to be seen at her anticoag clinic at [facility]. Dr. Darnelle Landry will be the referring physician regarding his continued care from a hematology standpoint. All prescritioin request will be handled through Dr. Darnelle Landry and his nurse.   He will be an archived patient with regards to our clinic.

## 2014-07-19 ENCOUNTER — Telehealth: Payer: Self-pay

## 2014-07-19 NOTE — Telephone Encounter (Signed)
Returned call to [facility]ette [facility].  Let her know information she needs with pt Factor V diagnosis is in old records that have been requested and will take a few weeks to come in.  We will send copy on to her when we receive them.  Lynette expressed appreciation and understanding.

## 2014-07-31 NOTE — Telephone Encounter (Signed)
Phone call - encounter closed. 

## 2014-08-17 NOTE — Telephone Encounter (Signed)
Encounter was telephone call. 

## 2017-01-22 ENCOUNTER — Ambulatory Visit (INDEPENDENT_AMBULATORY_CARE_PROVIDER_SITE_OTHER): Payer: Self-pay | Admitting: Student

## 2017-01-22 ENCOUNTER — Telehealth: Payer: Self-pay | Admitting: Pharmacist

## 2017-01-22 ENCOUNTER — Encounter: Payer: Self-pay | Admitting: Student

## 2017-01-22 ENCOUNTER — Ambulatory Visit (INDEPENDENT_AMBULATORY_CARE_PROVIDER_SITE_OTHER): Payer: Self-pay | Admitting: Pharmacist

## 2017-01-22 VITALS — BP 134/92 | HR 86 | Ht 71.0 in | Wt 210.0 lb

## 2017-01-22 DIAGNOSIS — Z720 Tobacco use: Secondary | ICD-10-CM

## 2017-01-22 DIAGNOSIS — I82409 Acute embolism and thrombosis of unspecified deep veins of unspecified lower extremity: Secondary | ICD-10-CM

## 2017-01-22 DIAGNOSIS — D6851 Activated protein C resistance: Secondary | ICD-10-CM

## 2017-01-22 DIAGNOSIS — D6859 Other primary thrombophilia: Secondary | ICD-10-CM

## 2017-01-22 LAB — POCT INR: INR: 1.7

## 2017-01-22 MED ORDER — WARFARIN SODIUM 5 MG PO TABS: 5.0000 mg | ORAL_TABLET | Freq: Every day | ORAL | 0 refills | Status: DC

## 2017-01-22 NOTE — Patient Instructions (Addendum)
We spoke with someone at the [facility] and have placed a referral-they will speak with Dr. Darnelle CatalanMagrinat and call you with an appointment.  If you do not hear from them in 1 WEEK please call and follow up: [phone]   If they can not see you in 3 weeks, call our office and we will schedule you for another INR check.

## 2017-01-22 NOTE — Progress Notes (Signed)
Cardiology Office Note    Date:  01/22/2017   ID:  Adam Landry, DOB [birth year], MRN [id number]  PCP:  No PCP Per Patient  Cardiologist: New to [facility] - Dr. Tresa Endo (DOD)  Chief Complaint  Patient presents with  . New Patient (Initial Visit)    Pt states no Sx.     History of Present Illness:    Adam Landry is a 36 y.o. male with past medical history of Factor V Leiden mutation with associated DVT's and PE's who presents to the office as a new patient self-referral for Coumadin monitoring.  In talking with the patient today, he denies any prior cardiac history. He reports trying to arrange follow-up with Hematology as he had previously been followed by Dr. Darnelle Catalan but was unable to get in contact with the office. Therefore he Google searched Coumadin clinic's and found our office.  He had been on Coumadin for over 10 years but upon moving to [place] with his job, he was able to obtain Xarelto for free. Upon losing his job in 09/2016, he was unable to afford his Xarelto and had 2 refills on an old Coumadin Rx and has therefore been taking this. He ran out approximately 4 weeks ago and has been taking a neighbor's Coumadin (still same dosing of 10mg  daily). He denies any evidence of active bleeding. No melena, hematochezia, or hemoptysis.  He denies any chest discomfort, palpitations, dyspnea with exertion, orthopnea, PND, or lower extremity edema. No history of coronary artery disease or cardiac arrhythmias. No known family history of coronary disease. Does report his mother and sister have hypertension.  He does smoke one pack per day and has done so for 15+ years. Denies any excessive alcohol use or recreational drug use.   Past Medical History:  Diagnosis Date  . Factor 5 Leiden mutation, heterozygous (HCC)     No past surgical history on file.  Current Medications: Outpatient Medications Prior to Visit  Medication Sig Dispense Refill  . warfarin (COUMADIN) 5 MG  tablet Take 2 tablets (10 mg total) by mouth daily. 28 tablet 0  . ibuprofen (ADVIL,MOTRIN) 600 MG tablet Take 1 tablet (600 mg total) by mouth every 6 (six) hours as needed for pain. (Patient not taking: Reported on 01/22/2017) 30 tablet 0  . Ibuprofen 200 MG CAPS Take 400 mg by mouth 3 (three) times daily as needed. Pt has been using Ibuprofen Liqui-Gels.     No facility-administered medications prior to visit.      Allergies:   Patient has no known allergies.   Social History   Social History  . Marital status: Single    Spouse name: N/A  . Number of children: N/A  . Years of education: N/A   Social History Main Topics  . Smoking status: Current Every Day Smoker    Packs/day: 1.00    Years: 15.00  . Smokeless tobacco: Never Used  . Alcohol use Yes  . Drug use: No  . Sexual activity: Yes   Other Topics Concern  . Not on file   Social History Narrative  . No narrative on file     Family History:  The patient's family history includes Hypertension in his mother and sister.   Review of Systems:   Please see the history of present illness.     General:  No chills, fever, night sweats or weight changes.  Cardiovascular:  No chest pain, dyspnea on exertion, edema, orthopnea, palpitations, paroxysmal nocturnal dyspnea. Dermatological: No  rash, lesions/masses Respiratory: No cough, dyspnea Urologic: No hematuria, dysuria Abdominal:   No nausea, vomiting, diarrhea, bright red blood per rectum, melena, or hematemesis Neurologic:  No visual changes, wkns, changes in mental status. MSK: Reports occasional right shoulder pain.  All other systems reviewed and are otherwise negative except as noted above.   Physical Exam:    VS:  BP (!) 134/92   Pulse 86   Ht 5\' 11"  (1.803 m)   Wt 210 lb (95.3 kg)   BMI 29.29 kg/m    General: Well developed, well nourished Caucasian male appearing in no acute distress. Head: Normocephalic, atraumatic, sclera non-icteric, no xanthomas, nares  are without discharge.  Neck: No carotid bruits. JVD not elevated.  Lungs: Respirations regular and unlabored, without wheezes or rales.  Heart: Regular rate and rhythm. No S3 or S4.  No murmur, no rubs, or gallops appreciated. Abdomen: Soft, non-tender, non-distended with normoactive bowel sounds. No hepatomegaly. No rebound/guarding. No obvious abdominal masses. Msk:  Strength and tone appear normal for age. No joint deformities or effusions. Extremities: No clubbing or cyanosis. No edema.  Distal pedal pulses are 2+ bilaterally. Neuro: Alert and oriented X 3. Moves all extremities spontaneously. No focal deficits noted. Psych:  Responds to questions appropriately with a normal affect. Skin: No rashes or lesions noted  Wt Readings from Last 3 Encounters:  01/22/17 210 lb (95.3 kg)  08/23/13 190 lb (86.2 kg)     Studies/Labs Reviewed:   EKG:  EKG is not ordered today.   Recent Labs: No results found for requested labs within last 8760 hours.   Lipid Panel No results found for: CHOL, TRIG, HDL, CHOLHDL, VLDL, LDLCALC, LDLDIRECT  Additional studies/ records that were reviewed today include:  None on File.   Assessment:    1. Factor V Leiden, prothrombin gene mutation (HCC)   2. Hypercoagulable state (HCC)   3. Tobacco use      Plan:   In order of problems listed above:  1. Factor V Leiden/ Hypercoagulable State - The patient has no cardiac history or current symptoms. He simply presented to our office because he noted on the website we had a Coumadin clinic and had been unable to get in touch with his prior hematologist. - We initially contacted the hematology office but they are unable to see him for at least 3 weeks. Therefore, he was seen today for initial evaluation and added to our Coumadin clinic for safety concerns as he was out of Coumadin and his last INR check was 4+ months ago.  - RN called the Hematology office and spoke with Dr. Darrall DearsMagrinat's office. They will  call him to schedule an appointment and a referral was entered in EPIC.   - will ask Pharmacy to see today as he needs an INR check and refill on his Coumadin with his history of a hypercoagulable state and prior PE/DVT's.  - no further Cardiology workup is indicated at this time. This was discussed with Dr. Tresa EndoKelly (DOD) who was in agreement with this assessment and plan. Our pharmacy can see him back for an additional INR check if unable to get re-established with his Hematologist within the next 3 weeks. Otherwise, he should follow-up with Cardiology on an as-needed basis.   2. Tobacco Use - cessation advised.    Medication Adjustments/Labs and Tests Ordered: Current medicines are reviewed at length with the patient today.  Concerns regarding medicines are outlined above.  Medication changes, Labs and Tests ordered today are  listed in the Patient Instructions below.  Patient Instructions  We spoke with someone at the [facility] and have placed a referral-they will speak with Dr. Darnelle Catalan and call you with an appointment.  If you do not hear from them in 1 WEEK please call and follow up: [phone]   If they can not see you in 3 weeks, call our office and we will schedule you for another INR check.    Signed, Ellsworth Lennox, PA  01/22/2017 4:32 PM    [facility] [address] [place], [address] [place], [place]  [address] Phone: [phone]; Fax: [phone]  [address] [place], [place] [address] Phone: [phone]

## 2017-01-26 ENCOUNTER — Telehealth: Payer: Self-pay | Admitting: Oncology

## 2017-01-26 NOTE — Telephone Encounter (Signed)
swptto confirm 3/12 appt at 830 am per referral message. Pt is an est pt of GM who needed a follow up appt

## 2017-01-26 NOTE — Telephone Encounter (Signed)
See anticoagulation note 

## 2017-02-19 ENCOUNTER — Other Ambulatory Visit: Payer: Self-pay | Admitting: Emergency Medicine

## 2017-02-19 MED ORDER — WARFARIN SODIUM 10 MG PO TABS: 10.0000 mg | ORAL_TABLET | Freq: Every day | ORAL | 0 refills | Status: DC

## 2017-02-27 ENCOUNTER — Other Ambulatory Visit: Payer: Self-pay | Admitting: *Deleted

## 2017-02-27 DIAGNOSIS — I82409 Acute embolism and thrombosis of unspecified deep veins of unspecified lower extremity: Secondary | ICD-10-CM

## 2017-02-27 DIAGNOSIS — D6851 Activated protein C resistance: Secondary | ICD-10-CM

## 2017-03-02 ENCOUNTER — Other Ambulatory Visit: Payer: Self-pay | Admitting: Oncology

## 2017-03-02 ENCOUNTER — Encounter: Payer: Self-pay | Admitting: Oncology

## 2017-03-02 ENCOUNTER — Ambulatory Visit (HOSPITAL_BASED_OUTPATIENT_CLINIC_OR_DEPARTMENT_OTHER): Payer: Self-pay | Admitting: Oncology

## 2017-03-02 ENCOUNTER — Other Ambulatory Visit (HOSPITAL_BASED_OUTPATIENT_CLINIC_OR_DEPARTMENT_OTHER): Payer: Self-pay

## 2017-03-02 VITALS — BP 162/98 | HR 86 | Temp 97.8°F | Resp 18 | Ht 71.0 in | Wt 205.4 lb

## 2017-03-02 DIAGNOSIS — Z7901 Long term (current) use of anticoagulants: Secondary | ICD-10-CM

## 2017-03-02 DIAGNOSIS — D6859 Other primary thrombophilia: Secondary | ICD-10-CM

## 2017-03-02 DIAGNOSIS — D6851 Activated protein C resistance: Secondary | ICD-10-CM

## 2017-03-02 DIAGNOSIS — I82409 Acute embolism and thrombosis of unspecified deep veins of unspecified lower extremity: Secondary | ICD-10-CM

## 2017-03-02 DIAGNOSIS — I825Z3 Chronic embolism and thrombosis of unspecified deep veins of distal lower extremity, bilateral: Secondary | ICD-10-CM

## 2017-03-02 DIAGNOSIS — Z86718 Personal history of other venous thrombosis and embolism: Secondary | ICD-10-CM

## 2017-03-02 LAB — CBC WITH DIFFERENTIAL/PLATELET
BASO%: 0.4 % (ref 0.0–2.0)
Basophils Absolute: 0 10*3/uL (ref 0.0–0.1)
EOS%: 3 % (ref 0.0–7.0)
Eosinophils Absolute: 0.2 10*3/uL (ref 0.0–0.5)
HCT: 45.8 % (ref 38.4–49.9)
HGB: 16.3 g/dL (ref 13.0–17.1)
LYMPH%: 25.1 % (ref 14.0–49.0)
MCH: 32.2 pg (ref 27.2–33.4)
MCHC: 35.6 g/dL (ref 32.0–36.0)
MCV: 90.5 fL (ref 79.3–98.0)
MONO#: 0.6 10*3/uL (ref 0.1–0.9)
MONO%: 8.6 % (ref 0.0–14.0)
NEUT#: 4.4 10*3/uL (ref 1.5–6.5)
NEUT%: 62.9 % (ref 39.0–75.0)
Platelets: 263 10*3/uL (ref 140–400)
RBC: 5.06 10*6/uL (ref 4.20–5.82)
RDW: 13.2 % (ref 11.0–14.6)
WBC: 6.9 10*3/uL (ref 4.0–10.3)
lymph#: 1.7 10*3/uL (ref 0.9–3.3)
nRBC: 0 % (ref 0–0)

## 2017-03-02 LAB — PROTIME-INR
INR: 2.7 (ref 2.00–3.50)
Protime: 32.4 Seconds — ABNORMAL HIGH (ref 10.6–13.4)

## 2017-03-02 MED ORDER — WARFARIN SODIUM 2.5 MG PO TABS: 2.5000 mg | ORAL_TABLET | ORAL | 4 refills | Status: DC

## 2017-03-02 MED ORDER — WARFARIN SODIUM 10 MG PO TABS: 10.0000 mg | ORAL_TABLET | Freq: Every day | ORAL | 4 refills | Status: DC

## 2017-03-02 NOTE — Progress Notes (Signed)
[facility]  [phone] [phone]     ID: KRUZE ATCHLEY DOB: May 27, 1981  MR#: [id number]  JYN#:[id number]  Patient Care Team: No Pcp Per Patient as PCP - General (General Practice) Chauncey Cruel, MD OTHER MD:  CHIEF COMPLAINT: Coagulopathy  CURRENT TREATMENT: Warfarin   HISTORY OF PRESENT ILLNESS: Cassey has moved back in town and is resuming follow-up here. For the past several years he has been living in [place], [place] And his Coumadin was monitored by a local hematologist. He tells me there had been no unusual bleeding or clotting complications in that interval of time. He is now back in town and needs to establish himself in our office to resume monitoring of his life long warfarin. Recall that he has factor V Leiden mutation with history of multiple DVTs and pulmonary embolus  INTERVAL HISTORY: He is currently living with his mother in [place]. 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   REVIEW OF SYSTEMS: He has pain on abduction of the right upper extremity which is a little bit weak as a result. A detailed review of systems is otherwise benign  PAST MEDICAL HISTORY: Past Medical History:  Diagnosis Date  . Factor 5 Leiden mutation, heterozygous ([place])   . Hypercholesterolemia     PAST SURGICAL HISTORY: No past surgical history on file.  FAMILY HISTORY Family History  Problem Relation Age of Onset  . Hypertension Mother   . 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 Smoking status: Current Every Day Smoker    Packs/day: 1.00   Years: 15.00  . Smokeless tobacco: Never Used  . Alcohol use Yes     Colonoscopy:  PSA:  Bone density:   No Known Allergies  Current Outpatient Prescriptions  Medication Sig Dispense Refill  . warfarin (COUMADIN) 10 MG tablet Take 1 tablet (10 mg total) by mouth daily. Take 74m daily; except on Thursday take 133m. 35 tablet 0   No current facility-administered medications for this visit.     OBJECTIVE: Young white male in no acute distress Vitals:   03/02/17 0922  BP: (!) 162/98  Pulse: 86  Resp: 18  Temp: 97.8 F (36.6 C)     Body mass index is 28.65 kg/m.    ECOG FS:0 - Asymptomatic  Ocular: Sclerae unicteric, pupils equal, round and reactive to light Ear-nose-throat: Oropharynx clear and moist Lymphatic: No cervical or supraclavicular adenopathy Lungs no rales or rhonchi, good excursion bilaterally Heart regular rate and rhythm, no murmur appreciated Abd soft, nontender, positive bowel sounds MSK no focal spinal tenderness, can abduction to about 80 on the right, limited by pain Neuro: non-focal, well-oriented, appropriate affect    LAB RESULTS:  CMP     Component Value Date/Time   NA 140 09/29/2010 1730   K 4.0 09/29/2010 1730   CL 105 09/29/2010 1730   CO2 29 09/29/2010 1730   GLUCOSE 91 09/29/2010 1730   BUN 9 09/29/2010 1730   CREATININE 0.85 09/29/2010 1730   CALCIUM 9.7 09/29/2010 1730   GFRNONAA >60 09/29/2010 1730   GFRAA  09/29/2010 1730    >60        The eGFR has been calculated using the MDRD equation. This calculation has not been validated in all clinical situations. eGFR's persistently <60 mL/min signify possible Chronic Kidney Disease.    INo results found for: SPEP, UPEP  Lab Results  Component Value Date   WBC 6.9 03/02/2017   NEUTROABS 4.4 03/02/2017   HGB 16.3 03/02/2017   HCT 45.8 03/02/2017   MCV 90.5 03/02/2017   PLT 263 03/02/2017      Chemistry      Component Value Date/Time   NA 140 09/29/2010 1730   K  4.0 09/29/2010 1730   CL 105 09/29/2010 1730   CO2 29 09/29/2010 1730   BUN 9 09/29/2010 1730   CREATININE 0.85 09/29/2010 1730      Component Value Date/Time   CALCIUM 9.7 09/29/2010 1730       No results found for: LABCA2  No components found for: LABCA125   Recent Labs Lab 03/02/17 0902  INR 2.70    Urinalysis    Component Value Date/Time   COLORURINE YELLOW 05/11/2010 [place] 05/11/2010 1639   LABSPEC 1.014 05/11/2010 1639   PHURINE 7.0 05/11/2010 1639   GLUCOSEU NEGATIVE 05/11/2010 1639   HGBUR NEGATIVE 05/11/2010 1639   [place] 05/11/2010 1639   KETONESUR NEGATIVE 05/11/2010 1639   PROTEINUR NEGATIVE 05/11/2010 1639   UROBILINOGEN 0.2 05/11/2010 1639   NITRITE NEGATIVE 05/11/2010 1639   LEUKOCYTESUR  05/11/2010 1639    NEGATIVE MICROSCOPIC NOT DONE ON URINES WITH NEGATIVE PROTEIN, BLOOD, LEUKOCYTES, NITRITE, OR GLUCOSE <1000 mg/dL.     STUDIES: No results found.  ELIGIBLE FOR AVAILABLE RESEARCH PROTOCOL: no  ASSESSMENT: 36 [facility] manWith a factor V Leiden mutation, remote history of DVT 3 and remote history of pulmonary embolus; on lifelong Coumadin  PLAN: I reviewed with Quinlin the appropriate way to take his Coumadin and which medications particularly nonsteroidal see is not able to take. For pain the only thing he can take his Tylenol. We also reviewed procedures in case he does develop bleeding.  The problem in his right shoulder is most likely bursitis. If Tylenol rest and ice does not take care of the problem we can always obtain an MRI and refer to orthopedics but he is very reluctant because he has no insurance at this point.  He tells me his cholesterol is controlled on diet.  We are going to be checking his INR on a monthly basis. He will return to see me in one year. He knows to call for any other issues that may develop before that visit.  Chauncey Cruel, MD   03/02/2017 9:40 AM Medical Oncology  and Hematology [facility] [address] [place], [place] [address] Tel. [phone]    Fax. [phone]

## 2017-03-09 ENCOUNTER — Other Ambulatory Visit: Payer: Self-pay

## 2017-03-09 ENCOUNTER — Ambulatory Visit: Payer: Self-pay | Admitting: Oncology

## 2017-03-25 ENCOUNTER — Other Ambulatory Visit: Payer: Self-pay | Admitting: Oncology

## 2017-03-26 ENCOUNTER — Ambulatory Visit: Payer: Self-pay | Admitting: Pharmacist Clinician (PhC)/ Clinical Pharmacy Specialist

## 2017-03-26 DIAGNOSIS — D6851 Activated protein C resistance: Secondary | ICD-10-CM

## 2017-03-26 DIAGNOSIS — I825Z3 Chronic embolism and thrombosis of unspecified deep veins of distal lower extremity, bilateral: Secondary | ICD-10-CM

## 2017-03-26 DIAGNOSIS — D6859 Other primary thrombophilia: Secondary | ICD-10-CM

## 2017-03-31 ENCOUNTER — Other Ambulatory Visit: Payer: Self-pay

## 2017-04-02 ENCOUNTER — Other Ambulatory Visit: Payer: Self-pay | Admitting: *Deleted

## 2017-04-02 ENCOUNTER — Other Ambulatory Visit (HOSPITAL_BASED_OUTPATIENT_CLINIC_OR_DEPARTMENT_OTHER): Payer: Self-pay

## 2017-04-02 DIAGNOSIS — I82409 Acute embolism and thrombosis of unspecified deep veins of unspecified lower extremity: Secondary | ICD-10-CM

## 2017-04-02 DIAGNOSIS — D6851 Activated protein C resistance: Secondary | ICD-10-CM

## 2017-04-02 LAB — CBC WITH DIFFERENTIAL/PLATELET
BASO%: 0.5 % (ref 0.0–2.0)
Basophils Absolute: 0 10*3/uL (ref 0.0–0.1)
EOS%: 3.3 % (ref 0.0–7.0)
Eosinophils Absolute: 0.2 10*3/uL (ref 0.0–0.5)
HCT: 46.5 % (ref 38.4–49.9)
HEMOGLOBIN: 16 g/dL (ref 13.0–17.1)
LYMPH%: 30.4 % (ref 14.0–49.0)
MCH: 32.1 pg (ref 27.2–33.4)
MCHC: 34.4 g/dL (ref 32.0–36.0)
MCV: 93.2 fL (ref 79.3–98.0)
MONO#: 0.6 10*3/uL (ref 0.1–0.9)
MONO%: 10.7 % (ref 0.0–14.0)
NEUT#: 3.3 10*3/uL (ref 1.5–6.5)
NEUT%: 55.1 % (ref 39.0–75.0)
Platelets: 300 10*3/uL (ref 140–400)
RBC: 4.99 10*6/uL (ref 4.20–5.82)
RDW: 13.7 % (ref 11.0–14.6)
WBC: 6 10*3/uL (ref 4.0–10.3)
lymph#: 1.8 10*3/uL (ref 0.9–3.3)
nRBC: 0 % (ref 0–0)

## 2017-04-02 LAB — PROTIME-INR
INR: 2.1 (ref 2.00–3.50)
PROTIME: 25.2 s — AB (ref 10.6–13.4)

## 2017-04-02 NOTE — Telephone Encounter (Signed)
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  Adam Landry verbalized understanding as well as to call with local pharmacy for coumadin prescription to be filled.

## 2017-04-03 ENCOUNTER — Telehealth: Payer: Self-pay

## 2017-04-03 NOTE — Telephone Encounter (Signed)
INR 2.1 Current coumadin dose  daily, except  on Thurs. Next lab 5.8 No changes in dosage.  Pt aware

## 2017-04-06 ENCOUNTER — Telehealth: Payer: Self-pay | Admitting: *Deleted

## 2017-04-06 NOTE — Telephone Encounter (Signed)
This RN called and left a message on home answering machine for him to call [facility]. Per Dr. Darnelle Catalan, there is NO change in his coumadin dose. This message needs to be given to him.

## 2017-04-07 NOTE — Telephone Encounter (Signed)
"  I'm returning a call from Stacy yesterday to  call back." Instructed patient to continue coumadin as currently ordered based on yesterday's lab work.  No further questions or needs at this time.

## 2017-04-13 ENCOUNTER — Other Ambulatory Visit: Payer: Self-pay | Admitting: Oncology

## 2017-04-28 ENCOUNTER — Other Ambulatory Visit: Payer: Self-pay

## 2017-04-28 ENCOUNTER — Other Ambulatory Visit (HOSPITAL_BASED_OUTPATIENT_CLINIC_OR_DEPARTMENT_OTHER): Payer: Self-pay

## 2017-04-28 DIAGNOSIS — I82409 Acute embolism and thrombosis of unspecified deep veins of unspecified lower extremity: Secondary | ICD-10-CM

## 2017-04-28 DIAGNOSIS — Z7901 Long term (current) use of anticoagulants: Secondary | ICD-10-CM

## 2017-04-28 DIAGNOSIS — Z86718 Personal history of other venous thrombosis and embolism: Secondary | ICD-10-CM

## 2017-04-28 DIAGNOSIS — I825Z3 Chronic embolism and thrombosis of unspecified deep veins of distal lower extremity, bilateral: Secondary | ICD-10-CM

## 2017-04-28 DIAGNOSIS — D6851 Activated protein C resistance: Secondary | ICD-10-CM

## 2017-04-28 LAB — CBC WITH DIFFERENTIAL/PLATELET
BASO%: 0.9 % (ref 0.0–2.0)
Basophils Absolute: 0.1 10*3/uL (ref 0.0–0.1)
EOS%: 2 % (ref 0.0–7.0)
Eosinophils Absolute: 0.2 10*3/uL (ref 0.0–0.5)
HCT: 50.3 % — ABNORMAL HIGH (ref 38.4–49.9)
HEMOGLOBIN: 17.1 g/dL (ref 13.0–17.1)
LYMPH%: 29 % (ref 14.0–49.0)
MCH: 31.7 pg (ref 27.2–33.4)
MCHC: 34.1 g/dL (ref 32.0–36.0)
MCV: 93.2 fL (ref 79.3–98.0)
MONO#: 0.6 10*3/uL (ref 0.1–0.9)
MONO%: 6.4 % (ref 0.0–14.0)
NEUT%: 61.7 % (ref 39.0–75.0)
NEUTROS ABS: 6 10*3/uL (ref 1.5–6.5)
Platelets: 368 10*3/uL (ref 140–400)
RBC: 5.39 10*6/uL (ref 4.20–5.82)
RDW: 13.9 % (ref 11.0–14.6)
WBC: 9.7 10*3/uL (ref 4.0–10.3)
lymph#: 2.8 10*3/uL (ref 0.9–3.3)

## 2017-04-28 LAB — PROTIME-INR
INR: 2.4 (ref 2.00–3.50)
PROTIME: 28.8 s — AB (ref 10.6–13.4)

## 2017-04-29 ENCOUNTER — Telehealth: Payer: Self-pay

## 2017-04-29 ENCOUNTER — Ambulatory Visit (HOSPITAL_COMMUNITY)
Admission: RE | Admit: 2017-04-29 | Discharge: 2017-04-29 | Disposition: A | Discharge status: Home / Self Care | Payer: Self-pay | Source: Ambulatory Visit | Attending: Oncology | Admitting: Oncology

## 2017-04-29 DIAGNOSIS — M7989 Other specified soft tissue disorders: Secondary | ICD-10-CM | POA: Insufficient documentation

## 2017-04-29 DIAGNOSIS — I825Z3 Chronic embolism and thrombosis of unspecified deep veins of distal lower extremity, bilateral: Secondary | ICD-10-CM

## 2017-04-29 NOTE — Progress Notes (Signed)
VASCULAR LAB PRELIMINARY  PRELIMINARY  PRELIMINARY  PRELIMINARY  Right lower extremity venous duplex completed.    Preliminary report:  No evidence of Acute DVT, superficial thrombosis, or Baker's cyst. There appears to be a chronic DVT in the popliteal vein. No comparison images of DVT several years ago [place] in [place].  Jillien Yakel, RVS 04/29/2017, 10:05 AM

## 2017-04-29 NOTE — Telephone Encounter (Signed)
Spoke with vascular lab by phone who reports doppler study is negative for DVT.  Pt is aware.    INR 2.4 pt will continue current dose of warfarin. Current dose is 10mg  every day except Thurs when he takes 15mg s.   Pt aware.    Next INR check in 54month.  As for the mild swelling in the knee, the pt was advised to try a knee wrap when walking, elevate and ice when sitting, and continue to monitor for any increase in swelling or other signs of DVT

## 2017-04-30 ENCOUNTER — Encounter: Payer: Self-pay | Admitting: Oncology

## 2017-05-01 ENCOUNTER — Telehealth: Payer: Self-pay

## 2017-05-01 NOTE — Telephone Encounter (Signed)
Attempted to call mobile and home phone. Unable to lvm due to vm full. Tried to reach pt to let him know that per Dr.Magrinat, his doppler was negative for DVT. Will call pt back later again today.

## 2017-05-19 NOTE — Progress Notes (Signed)
No show

## 2017-05-26 ENCOUNTER — Other Ambulatory Visit: Payer: Self-pay

## 2017-06-23 ENCOUNTER — Other Ambulatory Visit: Payer: Self-pay

## 2017-07-21 ENCOUNTER — Other Ambulatory Visit: Payer: Self-pay

## 2017-08-18 ENCOUNTER — Other Ambulatory Visit: Payer: Self-pay

## 2017-08-25 ENCOUNTER — Encounter (INDEPENDENT_AMBULATORY_CARE_PROVIDER_SITE_OTHER): Payer: Self-pay

## 2017-08-25 ENCOUNTER — Ambulatory Visit (INDEPENDENT_AMBULATORY_CARE_PROVIDER_SITE_OTHER): Payer: BLUE CROSS/BLUE SHIELD | Admitting: Family Medicine

## 2017-08-25 ENCOUNTER — Encounter: Payer: Self-pay | Admitting: Family Medicine

## 2017-08-25 VITALS — BP 125/86 | HR 92 | Temp 98.1°F | Ht 69.5 in | Wt 196.0 lb

## 2017-08-25 DIAGNOSIS — D6851 Activated protein C resistance: Secondary | ICD-10-CM

## 2017-08-25 DIAGNOSIS — Z0000 Encounter for general adult medical examination without abnormal findings: Secondary | ICD-10-CM | POA: Diagnosis not present

## 2017-08-25 LAB — COAGUCHEK XS/INR WAIVED
INR: 1.9 — ABNORMAL HIGH (ref 0.9–1.1)
PROTHROMBIN TIME: 23.4 s

## 2017-08-25 NOTE — Progress Notes (Addendum)
   HPI  Patient presents today here to establish care with physical exam and factor V Leiden mutation  Patient states that he does not get his INR checked often enough. He has reasonably good medication compliance with Coumadin, no easy bruising or bleeding. He understands what diet to watch for.  He is exercising occupationally but no formal exercise routine Does not really watch his diet her cholesterol, has history of high cholesterol.  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 Now he's working as an Ship broker he's having left shoulder pain from working with his head all the time, he does not want treatment for this.  PMH: Factor V Leyden mutation, hyperlipidemia, substance abuse Surgical history: None Family history: Alcohol abuse in father, hyperlipidemia and mother, hypertension in mother and sister Social history: Current smoker, considering quitting, no alcohol or drug use. 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 Recheck in 6 weeks  Anticoagulation Warfarin Dose Instructions as of 08/25/2017      Dorene Grebe Tue Wed Thu Fri Sat   New Dose 10 mg 10 mg 10 mg 10 mg 15 mg 10 mg 10 mg       Orders Placed This Encounter  Procedures  . CoaguChek XS/INR Waived  . Lipid panel  . CMP14+EGFR  . CBC with Differential/Platelet  . TSH    Laroy Apple, MD [facility] 08/25/2017, 1:58 PM

## 2017-08-25 NOTE — Patient Instructions (Signed)
Great to meet you!   Health Maintenance, Male A healthy lifestyle and preventive care is important for your health and wellness. Ask your health care provider about what schedule of regular examinations is right for you. What should I know about weight and diet? Eat a Healthy Diet  Eat plenty of vegetables, fruits, whole grains, low-fat dairy products, and lean protein.  Do not eat a lot of foods high in solid fats, added sugars, or salt.  Maintain a Healthy Weight Regular exercise can help you achieve or maintain a healthy weight. You should:  Do at least 150 minutes of exercise each week. The exercise should increase your heart rate and make you sweat (moderate-intensity exercise).  Do strength-training exercises at least twice a week.  Watch Your Levels of Cholesterol and Blood Lipids  Have your blood tested for lipids and cholesterol every 5 years starting at 37 years of age. If you are at high risk for heart disease, you should start having your blood tested when you are 37 years old. You may need to have your cholesterol levels checked more often if: ? Your lipid or cholesterol levels are high. ? You are older than 37 years of age. ? You are at high risk for heart disease.  What should I know about cancer screening? Many types of cancers can be detected early and may often be prevented. Lung Cancer  You should be screened every year for lung cancer if: ? You are a current smoker who has smoked for at least 30 years. ? You are a former smoker who has quit within the past 15 years.  Talk to your health care provider about your screening options, when you should start screening, and how often you should be screened.  Colorectal Cancer  Routine colorectal cancer screening usually begins at 37 years of age and should be repeated every 5-10 years until you are 37 years old. You may need to be screened more often if early forms of precancerous polyps or small growths are found.  Your health care provider may recommend screening at an earlier age if you have risk factors for colon cancer.  Your health care provider may recommend using home test kits to check for hidden blood in the stool.  A small camera at the end of a tube can be used to examine your colon (sigmoidoscopy or colonoscopy). This checks for the earliest forms of colorectal cancer.  Prostate and Testicular Cancer  Depending on your age and overall health, your health care provider may do certain tests to screen for prostate and testicular cancer.  Talk to your health care provider about any symptoms or concerns you have about testicular or prostate cancer.  Skin Cancer  Check your skin from head to toe regularly.  Tell your health care provider about any new moles or changes in moles, especially if: ? There is a change in a mole's size, shape, or color. ? You have a mole that is larger than a pencil eraser.  Always use sunscreen. Apply sunscreen liberally and repeat throughout the day.  Protect yourself by wearing long sleeves, pants, a wide-brimmed hat, and sunglasses when outside.  What should I know about heart disease, diabetes, and high blood pressure?  If you are 18-39 years of age, have your blood pressure checked every 3-5 years. If you are 40 years of age or older, have your blood pressure checked every year. You should have your blood pressure measured twice-once when you are at a   hospital or clinic, and once when you are not at a hospital or clinic. Record the average of the two measurements. To check your blood pressure when you are not at a hospital or clinic, you can use: ? An automated blood pressure machine at a pharmacy. ? A home blood pressure monitor.  Talk to your health care provider about your target blood pressure.  If you are between 45-79 years old, ask your health care provider if you should take aspirin to prevent heart disease.  Have regular diabetes screenings by  checking your fasting blood sugar level. ? If you are at a normal weight and have a low risk for diabetes, have this test once every three years after the age of 45. ? If you are overweight and have a high risk for diabetes, consider being tested at a younger age or more often.  A one-time screening for abdominal aortic aneurysm (AAA) by ultrasound is recommended for men aged 65-75 years who are current or former smokers. What should I know about preventing infection? Hepatitis B If you have a higher risk for hepatitis B, you should be screened for this virus. Talk with your health care provider to find out if you are at risk for hepatitis B infection. Hepatitis C Blood testing is recommended for:  Everyone born from 1945 through 1965.  Anyone with known risk factors for hepatitis C.  Sexually Transmitted Diseases (STDs)  You should be screened each year for STDs including gonorrhea and chlamydia if: ? You are sexually active and are younger than 37 years of age. ? You are older than 37 years of age and your health care provider tells you that you are at risk for this type of infection. ? Your sexual activity has changed since you were last screened and you are at an increased risk for chlamydia or gonorrhea. Ask your health care provider if you are at risk.  Talk with your health care provider about whether you are at high risk of being infected with HIV. Your health care provider may recommend a prescription medicine to help prevent HIV infection.  What else can I do?  Schedule regular health, dental, and eye exams.  Stay current with your vaccines (immunizations).  Do not use any tobacco products, such as cigarettes, chewing tobacco, and e-cigarettes. If you need help quitting, ask your health care provider.  Limit alcohol intake to no more than 2 drinks per day. One drink equals 12 ounces of beer, 5 ounces of wine, or 1 ounces of hard liquor.  Do not use street drugs.  Do not  share needles.  Ask your health care provider for help if you need support or information about quitting drugs.  Tell your health care provider if you often feel depressed.  Tell your health care provider if you have ever been abused or do not feel safe at home. This information is not intended to replace advice given to you by your health care provider. Make sure you discuss any questions you have with your health care provider. Document Released: 06/05/2008 Document Revised: 08/06/2016 Document Reviewed: 09/11/2015 Elsevier Interactive Patient Education  2018 [employer]  

## 2017-08-26 LAB — CBC WITH DIFFERENTIAL/PLATELET
Basophils Absolute: 0 10*3/uL (ref 0.0–0.2)
Basos: 0 %
EOS (ABSOLUTE): 0.3 10*3/uL (ref 0.0–0.4)
EOS: 3 %
HEMATOCRIT: 46.1 % (ref 37.5–51.0)
HEMOGLOBIN: 15 g/dL (ref 13.0–17.7)
IMMATURE GRANS (ABS): 0 10*3/uL (ref 0.0–0.1)
Immature Granulocytes: 0 %
Lymphocytes Absolute: 2.5 10*3/uL (ref 0.7–3.1)
Lymphs: 25 %
MCH: 31.3 pg (ref 26.6–33.0)
MCHC: 32.5 g/dL (ref 31.5–35.7)
MCV: 96 fL (ref 79–97)
Monocytes Absolute: 0.7 10*3/uL (ref 0.1–0.9)
Monocytes: 7 %
Neutrophils Absolute: 6.5 10*3/uL (ref 1.4–7.0)
Neutrophils: 65 %
Platelets: 290 10*3/uL (ref 150–379)
RBC: 4.8 x10E6/uL (ref 4.14–5.80)
RDW: 14.1 % (ref 12.3–15.4)
WBC: 10 10*3/uL (ref 3.4–10.8)

## 2017-08-26 LAB — CMP14+EGFR
A/G RATIO: 2.2 (ref 1.2–2.2)
ALT: 31 IU/L (ref 0–44)
AST: 22 IU/L (ref 0–40)
Albumin: 4.6 g/dL (ref 3.5–5.5)
Alkaline Phosphatase: 57 IU/L (ref 39–117)
BUN / CREAT RATIO: 20 (ref 9–20)
BUN: 18 mg/dL (ref 6–20)
CHLORIDE: 102 mmol/L (ref 96–106)
CO2: 23 mmol/L (ref 20–29)
Calcium: 10 mg/dL (ref 8.7–10.2)
Creatinine, Ser: 0.91 mg/dL (ref 0.76–1.27)
GFR calc non Af Amer: 108 mL/min/{1.73_m2} (ref >59)
GFR, EST AFRICAN AMERICAN: 125 mL/min/{1.73_m2} (ref >59)
GLOBULIN, TOTAL: 2.1 g/dL (ref 1.5–4.5)
Glucose: 89 mg/dL (ref 65–99)
POTASSIUM: 4.4 mmol/L (ref 3.5–5.2)
SODIUM: 141 mmol/L (ref 134–144)
TOTAL PROTEIN: 6.7 g/dL (ref 6.0–8.5)

## 2017-08-26 LAB — LIPID PANEL
CHOLESTEROL TOTAL: 211 mg/dL — AB (ref 100–199)
Chol/HDL Ratio: 3.9 ratio (ref 0.0–5.0)
HDL: 54 mg/dL (ref >39)
LDL Calculated: 111 mg/dL — ABNORMAL HIGH (ref 0–99)
TRIGLYCERIDES: 229 mg/dL — AB (ref 0–149)
VLDL Cholesterol Cal: 46 mg/dL — ABNORMAL HIGH (ref 5–40)

## 2017-08-26 LAB — TSH: TSH: 2.09 u[IU]/mL (ref 0.450–4.500)

## 2017-09-09 ENCOUNTER — Ambulatory Visit (INDEPENDENT_AMBULATORY_CARE_PROVIDER_SITE_OTHER): Payer: BLUE CROSS/BLUE SHIELD | Admitting: Family Medicine

## 2017-09-09 ENCOUNTER — Encounter: Payer: Self-pay | Admitting: Family Medicine

## 2017-09-09 VITALS — BP 131/77 | HR 67 | Temp 97.7°F | Ht 69.5 in | Wt 194.4 lb

## 2017-09-09 DIAGNOSIS — M62838 Other muscle spasm: Secondary | ICD-10-CM

## 2017-09-09 MED ORDER — TIZANIDINE HCL 4 MG PO CAPS: 4.0000 mg | ORAL_CAPSULE | Freq: Two times a day (BID) | ORAL | 0 refills | Status: DC | PRN

## 2017-09-09 NOTE — Patient Instructions (Signed)
I prescribed you a muscle relaxer. This can cause sedation. Please do not operate heavy machinery or a vehicle while taking this medication. We actually discussed consideration for oral NSAID. However, you on Coumadin and cannot take these types of medications while on Coumadin. Continue Tylenol as needed. This is safe with your current medications. Perform stretches that I provided for you. If any other worrisome symptoms that we discussed develop such as: Stiffness in her neck, dizziness, headache, nausea, vomiting, fevers, numbness or tingling down your arms, weakness, please seek immediate medical attention.

## 2017-09-09 NOTE — Progress Notes (Signed)
Subjective: ZO:XWRU spasm PCP: Elenora Gamma, MD EAV:[id number] Adam Landry is a 36 y.o. male presenting to clinic today for:  1. Neck spasm Patient reports acute onset of neck spasm this morning. He reports he has difficulty rotating his neck to the right. He describes pain as a pulling/sharp pain that radiates slightly down into his shoulder and upper back. He thinks that he may have caused this by rolling over while on the floor. Additionally, he notes he was doing some heavy over the head activities at work yesterday. He's been doing stretches since 4:30 this morning, alternating heat and ice to the neck, and using a muscle rub with very little improvement. No numbness or tingling. No weakness. No headache, fevers, chills, dizziness. Past medical history significant for clotting disorder which he is on warfarin for.  No Known Allergies Past Medical History:  Diagnosis Date  . Clotting disorder (HCC)   . Factor 5 Leiden mutation, heterozygous (HCC)   . Hypercholesterolemia   . Substance abuse    Family History  Problem Relation Age of Onset  . Hypertension Mother   . Hyperlipidemia Mother   . Hypertension Sister   . Alcohol abuse Father    Social Hx: daily smoker.Current medications reviewed.   ROS: Per HPI  Objective: Office vital signs reviewed. BP 131/77   Pulse 67   Temp 97.7 F (36.5 C) (Oral)   Ht 5' 9.5" (1.765 m)   Wt 194 lb 6.4 oz (88.2 kg)   BMI 28.30 kg/m   Physical Examination:  General: Awake, alert, well nourished, well appearing male, No acute distress HEENT: Normal    Neck: No masses palpated. No lymphadenopathy    Eyes: PERRLA, extraocular membranes intact, sclera white Extremities: warm, well perfused, No edema, cyanosis or clubbing; +2 pulses bilaterally MSK: normal gait and normal station  C-Spine: Patient with full active range of motion in flexion. He has full active range of motion and extension but does have pain with this activity. He has  about 15 loss in rotation to the right and pain is elicited with this movement. He has full active range of motion with rotation to the left. No midline tenderness to C-spine. Mild tenderness to the right trapezius muscle posteriorly with associated increased tonicity. No tenderness to palpation to the right shoulder. Neuro: 5/5 UE Strength and light touch sensation grossly intact, no focal neurologic deficits  Assessment/ Plan: 36 y.o. male   1. Neck muscle spasm No previous trauma or injury to her neck. No need for imaging studies at this time. I suspect that he is having a trapezius muscle spasm. I encouraged him to continue home exercises and stretches. A handout was provided today outlining these. He may continue Tylenol if needed for pain. He does have a clotting disorder, for which he is on warfarin and therefore, at this time I do not recommend an NSAID. I prescribed a muscle relaxer for him to take twice a day as needed. I cautioned sedation. A work note was provided excusing him from today. He may return to work without restrictions tomorrow. Strict return precautions were reviewed with the patient, see AVS. He will follow-up as needed.    Meds ordered this encounter  Medications  . tiZANidine (ZANAFLEX) 4 MG capsule    Sig: Take 1 capsule (4 mg total) by mouth 2 (two) times daily as needed for muscle spasms.    Dispense:  20 capsule    Refill:  0     Adam Landry  [address], [place] [phone]

## 2017-09-15 ENCOUNTER — Other Ambulatory Visit: Payer: Self-pay

## 2017-10-06 ENCOUNTER — Ambulatory Visit: Payer: BLUE CROSS/BLUE SHIELD | Admitting: Family Medicine

## 2017-10-06 ENCOUNTER — Encounter: Payer: Self-pay | Admitting: Family Medicine

## 2017-10-13 ENCOUNTER — Other Ambulatory Visit: Payer: Self-pay

## 2017-11-10 ENCOUNTER — Other Ambulatory Visit: Payer: Self-pay

## 2017-12-08 ENCOUNTER — Other Ambulatory Visit: Payer: Self-pay

## 2017-12-18 ENCOUNTER — Ambulatory Visit (INDEPENDENT_AMBULATORY_CARE_PROVIDER_SITE_OTHER): Payer: BLUE CROSS/BLUE SHIELD | Admitting: Pharmacist Clinician (PhC)/ Clinical Pharmacy Specialist

## 2017-12-18 ENCOUNTER — Encounter: Payer: Self-pay | Admitting: Pharmacist Clinician (PhC)/ Clinical Pharmacy Specialist

## 2017-12-18 DIAGNOSIS — D6851 Activated protein C resistance: Secondary | ICD-10-CM

## 2017-12-18 LAB — COAGUCHEK XS/INR WAIVED
INR: 2.1 — ABNORMAL HIGH (ref 0.9–1.1)
Prothrombin Time: 24.8 s

## 2017-12-18 MED ORDER — WARFARIN SODIUM 5 MG PO TABS: 5.0000 mg | ORAL_TABLET | ORAL | 3 refills | Status: DC

## 2017-12-18 MED ORDER — WARFARIN SODIUM 10 MG PO TABS: 10.0000 mg | ORAL_TABLET | Freq: Every day | ORAL | 3 refills | Status: DC

## 2017-12-18 NOTE — Patient Instructions (Addendum)
  Description   INR goal is 2-3  INR today is 2.1  Continue taking 10mg  daily except for 15mg  ( One 10mg  and 5mg  tablet)  on Thursdays.

## 2018-01-05 ENCOUNTER — Other Ambulatory Visit: Payer: Self-pay

## 2018-02-02 ENCOUNTER — Other Ambulatory Visit: Payer: Self-pay

## 2018-02-08 DIAGNOSIS — Z6825 Body mass index (BMI) 25.0-25.9, adult: Secondary | ICD-10-CM | POA: Diagnosis not present

## 2018-02-08 DIAGNOSIS — B372 Candidiasis of skin and nail: Secondary | ICD-10-CM | POA: Diagnosis not present

## 2018-03-01 ENCOUNTER — Other Ambulatory Visit: Payer: Self-pay

## 2018-03-01 DIAGNOSIS — I825Z3 Chronic embolism and thrombosis of unspecified deep veins of distal lower extremity, bilateral: Secondary | ICD-10-CM

## 2018-03-02 ENCOUNTER — Ambulatory Visit (HOSPITAL_BASED_OUTPATIENT_CLINIC_OR_DEPARTMENT_OTHER): Payer: Self-pay | Admitting: Oncology

## 2018-03-02 ENCOUNTER — Other Ambulatory Visit: Payer: Self-pay

## 2018-03-02 ENCOUNTER — Encounter: Payer: Self-pay | Admitting: Oncology

## 2018-03-02 DIAGNOSIS — I82493 Acute embolism and thrombosis of other specified deep vein of lower extremity, bilateral: Secondary | ICD-10-CM

## 2018-03-02 DIAGNOSIS — D6851 Activated protein C resistance: Secondary | ICD-10-CM

## 2018-03-02 NOTE — Progress Notes (Signed)
No show

## 2018-03-04 ENCOUNTER — Ambulatory Visit: Payer: BLUE CROSS/BLUE SHIELD | Admitting: Family Medicine

## 2018-03-04 ENCOUNTER — Encounter: Payer: Self-pay | Admitting: Family Medicine

## 2018-03-04 VITALS — BP 137/89 | HR 103 | Temp 97.4°F | Ht 69.5 in | Wt 191.0 lb

## 2018-03-04 DIAGNOSIS — D6851 Activated protein C resistance: Secondary | ICD-10-CM

## 2018-03-04 DIAGNOSIS — N489 Disorder of penis, unspecified: Secondary | ICD-10-CM | POA: Diagnosis not present

## 2018-03-04 DIAGNOSIS — L309 Dermatitis, unspecified: Secondary | ICD-10-CM

## 2018-03-04 LAB — COAGUCHEK XS/INR WAIVED
INR: 2.3 — ABNORMAL HIGH (ref 0.9–1.1)
Prothrombin Time: 27.1 s

## 2018-03-04 MED ORDER — TRIAMCINOLONE ACETONIDE 0.1 % EX CREA: 1.0000 "application " | TOPICAL_CREAM | Freq: Two times a day (BID) | CUTANEOUS | 0 refills | Status: DC

## 2018-03-04 NOTE — Progress Notes (Signed)
   HPI  Patient presents today with skin rash.  Patient has history of eczema.  He states that he has typical rash on his right posterior leg and small lesion beginning on the left posterior leg.  Is also noticed some redness of his navel today and he has cleaned this out aggressively with peroxide and application of Neosporin.  Patient states that he first noticed lesions on his penis about 18 months ago. Patient states that he stopped they cleared up  He now has a girlfriend for the last 4 months and the lesions have come back.  He initially had one lesion on the glans of the penis, he has now for small flat lesions on the shaft of the penis  PMH: Smoking status noted ROS: Per HPI  Objective: BP 137/89   Pulse (!) 103   Temp (!) 97.4 F (36.3 C) (Oral)   Ht 5' 9.5" (1.765 m)   Wt 191 lb (86.6 kg)   BMI 27.80 kg/m  Gen: NAD, alert, cooperative with exam HEENT: NCAT CV: RRR, good S1/S2, no murmur Resp: CTABL, no wheezes, non-labored Ext: No edema, warm Neuro: Alert and oriented, No gross deficits Skin Left posterior leg with approximately 2 cm roughly circular erythematous slightly raised lesion with no induration or drainage. Right posterior leg with very small 2-4 mm erythematous lesion.  Penis Approximately 1 cm x 1/2 cm erythematous lesion on the glans without clear borders. 4-5 lesions on the shaft of the penis that are slightly hypopigmented to red approximately 3-5 mm in diameter and slightly raised but flat  Assessment and plan:  #Lesion of penis Most likely molluscum, discussed with patient, he believes this is only eczema but is willing to see urology to discuss. Discussed treatment options including cryotherapy and curretage Urology referral  #Atopic dermatitis, eczema Kenalog cream, I do not believe the penile lesions are simply eczema  #Factor V Leyden Continue Coumadin at current dose, good medication compliance INR is therapeutic at 2.3  tonight Follow-up for INR level in 6 weeks, patient states that he does not follow-up more frequently due to cost    Orders Placed This Encounter  Procedures  . CoaguChek XS/INR Waived  . Ambulatory referral to Urology    Referral Priority:   Routine    Referral Type:   Consultation    Referral Reason:   Specialty Services Required    Requested Specialty:   Urology    Number of Visits Requested:   1    Meds ordered this encounter  Medications  . triamcinolone cream (KENALOG) 0.1 %    Sig: Apply 1 application topically 2 (two) times daily.    Dispense:  30 g    Refill:  0    Murtis SinkSam Teresea Donley, MD [facility] 03/04/2018, 5:14 PM

## 2018-03-04 NOTE — Patient Instructions (Addendum)
Great to see you!   Try the triamcinolone cream on the leg lesions  We will work on a referral to urology

## 2018-06-11 ENCOUNTER — Ambulatory Visit: Payer: BLUE CROSS/BLUE SHIELD | Admitting: Family Medicine

## 2018-06-11 ENCOUNTER — Encounter: Payer: Self-pay | Admitting: Family Medicine

## 2018-06-11 VITALS — BP 138/89 | HR 86 | Temp 98.4°F | Ht 69.5 in | Wt 198.4 lb

## 2018-06-11 DIAGNOSIS — S71131A Puncture wound without foreign body, right thigh, initial encounter: Secondary | ICD-10-CM | POA: Diagnosis not present

## 2018-06-11 DIAGNOSIS — Z7901 Long term (current) use of anticoagulants: Secondary | ICD-10-CM

## 2018-06-11 DIAGNOSIS — Z23 Encounter for immunization: Secondary | ICD-10-CM | POA: Diagnosis not present

## 2018-06-11 DIAGNOSIS — T148XXA Other injury of unspecified body region, initial encounter: Secondary | ICD-10-CM

## 2018-06-11 DIAGNOSIS — M75102 Unspecified rotator cuff tear or rupture of left shoulder, not specified as traumatic: Secondary | ICD-10-CM

## 2018-06-11 LAB — COAGUCHEK XS/INR WAIVED
INR: 4 — ABNORMAL HIGH (ref 0.9–1.1)
PROTHROMBIN TIME: 48.2 s

## 2018-06-11 MED ORDER — TRIAMCINOLONE ACETONIDE 40 MG/ML IJ SUSP
40.0000 mg | Freq: Once | INTRAMUSCULAR | Status: AC
Start: 2018-06-11 — End: 2018-06-11
  Administered 2018-06-11: 40 mg via INTRA_ARTICULAR

## 2018-06-11 NOTE — Progress Notes (Signed)
   HPI  Patient presents today here for puncture wound, left shoulder pain, INR.  Patient states that 2 days ago he was working at home when he slipped using a pair 10 steps and hit himself in the right thigh.  He has bruising and burning type pain over the area, however has not had any drainage or other problems.  Left shoulder pain Patient states that about 3 months ago he fell landing on an outstretched left hand.  He has had left shoulder pain since that time.  At times it so severe that he cannot lift his shoulder.  He is concerned about rotator cuff tear.  Anticoagulation History of DVT, takes 1 pill of 10 mg Coumadin daily, he takes 15 mg on Thursdays.  No recent bleeding, however he has seen more bruising than usual. No diet changes,   PMH: Smoking status noted ROS: Per HPI  Objective: BP 138/89   Pulse 86   Temp 98.4 F (36.9 C) (Oral)   Ht 5' 9.5" (1.765 m)   Wt 198 lb 6.4 oz (90 kg)   BMI 28.88 kg/m  Gen: NAD, alert, cooperative with exam HEENT: NCAT CV: RRR, good S1/S2, no murmur Resp: CTABL, no wheezes, non-labored Ext: No edema, warm Neuro: Alert and oriented, No gross deficits Msk:  Full range of motion bilateral shoulders, tenderness over the supraspinatus on the left shoulder, positive Hawkins and empty can test on the left shoulder  Skin:  Anterior thigh with approximately 3 mm circular lesion surrounded by approximately 3 cm in diameter bruise with larger area of bruising measuring about 10 cm that is very faint  One bruise on the left flank approximately 3 cm in diameter, another in the mid abdomen 3 cm in diameter  L shoulder injection Informed consent obtained and placed in chart.  Time out performed.  Area cleaned with iodine x 2 and wiped clear with alcohol swab.  Using 21 1/2 gauge needle 1 cc Kenalog and 3 cc's 1% Lidocaine were injected in subacromial space via posterior approach.  Sterile bandage placed.  Patient tolerated procedure well.  No  complications.     Assessment and plan:  #Puncture wound Right thigh Tetanus vaccine today No signs of infection Discussed bruising with supratherapeutic INR  #Rotator cuff tendinitis Subacromial bursal injection today given 2 days out of work for rest If not improving would refer to orthopedics  #Chronic anticoagulation, history of DVT with factor V Leyden Decrease Coumadin dose from 75 mg to 70 mg a week, hold Coumadin x2 days with bruising and supratherapeutic INR at 4.0. Recheck in 10 to 14 days He should correct some but kenalog injection will change predictability of INR as well.     Meds ordered this encounter  Medications  . triamcinolone acetonide (KENALOG-40) injection 40 mg    Adam SinkSam Brennin Durfee, MD [facility] 06/11/2018, 3:59 PM

## 2018-06-11 NOTE — Patient Instructions (Signed)
Great to see you!  Come back in 2-3 weeks for an INR check

## 2018-06-29 ENCOUNTER — Ambulatory Visit: Payer: BLUE CROSS/BLUE SHIELD | Admitting: Family Medicine

## 2018-06-29 ENCOUNTER — Encounter: Payer: Self-pay | Admitting: Family Medicine

## 2018-06-29 VITALS — BP 135/90 | HR 66 | Temp 98.8°F | Ht 69.5 in | Wt 193.6 lb

## 2018-06-29 DIAGNOSIS — Z7901 Long term (current) use of anticoagulants: Secondary | ICD-10-CM | POA: Diagnosis not present

## 2018-06-29 DIAGNOSIS — B001 Herpesviral vesicular dermatitis: Secondary | ICD-10-CM | POA: Diagnosis not present

## 2018-06-29 DIAGNOSIS — D6851 Activated protein C resistance: Secondary | ICD-10-CM

## 2018-06-29 LAB — COAGUCHEK XS/INR WAIVED
INR: 1.6 — AB (ref 0.9–1.1)
PROTHROMBIN TIME: 19.5 s

## 2018-06-29 MED ORDER — VALACYCLOVIR HCL 1 G PO TABS: ORAL_TABLET | ORAL | 3 refills | Status: DC

## 2018-06-29 NOTE — Patient Instructions (Signed)
Great to see you!   

## 2018-06-29 NOTE — Progress Notes (Signed)
   HPI  Patient presents today for INR as well as herpes labialis.  Patient states he occasionally gets cold sores, he would like some medicine for that.  Patient also states that he has been taking Coumadin 1 pill once daily, his old regimen is 1 pill once daily and 1-1/2 tablets on Thursday. No bleeding. Has had difficulty following up in the past.    PMH: Smoking status noted ROS: Per HPI  Objective: BP 135/90   Pulse 66   Temp 98.8 F (37.1 C) (Oral)   Ht 5' 9.5" (1.765 m)   Wt 193 lb 9.6 oz (87.8 kg)   BMI 28.18 kg/m  Gen: NAD, alert, cooperative with exam HEENT: NCAT CV: RRR, good S1/S2, no murmur Resp: CTABL, no wheezes, non-labored Ext: No edema, warm Neuro: Alert and oriented, No gross deficits  Assessment and plan:  #Chronic anticoagulation, factor V Leyden Slightly subtherapeutic today, patient is currently taking 70 mg of Coumadin weekly, increase back to 75 mg Recheck in 6 weeks Discussed the importance of good compliance  #Herpes labialis Discussed Valtrex and usual dosing No active lesions    Orders Placed This Encounter  Procedures  . CoaguChek XS/INR Waived    Meds ordered this encounter  Medications  . valACYclovir (VALTREX) 1000 MG tablet    Sig: 2 tabs by mouth and repeat in 12 hours as needed for cold sores    Dispense:  12 tablet    Refill:  3    Murtis SinkSam Bradshaw, MD [facility] 06/29/2018, 8:23 AM

## 2018-07-28 ENCOUNTER — Telehealth: Payer: Self-pay | Admitting: Family Medicine

## 2018-07-28 NOTE — Telephone Encounter (Signed)
Patient aware.  He states he had spoken with the endodontist who feels it will be okay and he will be fine without coming off of his Coumadin.

## 2018-07-28 NOTE — Telephone Encounter (Signed)
Pt is scheduled to have a root canal on 8/13 and he is on coumadian and he is wanting to know if that is going to be an issue. He states that if he doesn't answer it is okay to leave a VM

## 2018-07-28 NOTE — Telephone Encounter (Signed)
Please hold the coumadin for 5 days before the procedure!!!

## 2018-07-28 NOTE — Telephone Encounter (Signed)
Covering PCP- please advise  

## 2018-08-16 ENCOUNTER — Ambulatory Visit: Payer: BLUE CROSS/BLUE SHIELD | Admitting: Family Medicine

## 2018-08-16 ENCOUNTER — Encounter: Payer: Self-pay | Admitting: Family Medicine

## 2018-08-16 VITALS — BP 138/87 | HR 93 | Temp 98.7°F | Ht 69.5 in | Wt 196.6 lb

## 2018-08-16 DIAGNOSIS — Z7901 Long term (current) use of anticoagulants: Secondary | ICD-10-CM

## 2018-08-16 LAB — COAGUCHEK XS/INR WAIVED
INR: 1.8 — AB (ref 0.9–1.1)
PROTHROMBIN TIME: 22.2 s

## 2018-08-16 NOTE — Progress Notes (Signed)
Subjective: CC: Factor V Leiden mutation/chronic anticoagulation PCP: Elenora Gamma, MD Adam Landry is a 37 y.o. male presenting to clinic today for:  1.  Factor V Leiden mutation/chronic anticoagulation Patient with past medical history of multiple DVTs found to have factor V Leiden mutation.  Patient presents today for follow-up on his chronic anticoagulation.  He denies any substantial bleeding, melena, hematochezia.  He reports compliance with warfarin 10 mg daily with 15 mg on Thursdays.  He does report that he may have forgotten 1 or 2 doses.  He notes that he is very interested in getting the at home INR checker.  He reports that he has an upcoming appointment on 08/26/2018 for dental extraction.   ROS: Per HPI  No Known Allergies Past Medical History:  Diagnosis Date  . Clotting disorder (HCC)   . Factor 5 Leiden mutation, heterozygous (HCC)   . Hypercholesterolemia   . Substance abuse (HCC)     Current Outpatient Medications:  .  valACYclovir (VALTREX) 1000 MG tablet, 2 tabs by mouth and repeat in 12 hours as needed for cold sores, Disp: 12 tablet, Rfl: 3 .  warfarin (COUMADIN) 10 MG tablet, Take 1 tablet (10 mg total) by mouth daily., Disp: 90 tablet, Rfl: 3 Social History   Socioeconomic History  . Marital status: Single    Spouse name: Not on file  . Number of children: Not on file  . Years of education: Not on file  . Highest education level: Not on file  Occupational History  . Not on file  Social Needs  . Financial resource strain: Not on file  . Food insecurity:    Worry: Not on file    Inability: Not on file  . Transportation needs:    Medical: Not on file    Non-medical: Not on file  Tobacco Use  . Smoking status: Current Every Day Smoker    Packs/day: 1.00    Years: 15.00    Pack years: 15.00  . Smokeless tobacco: Never Used  Substance and Sexual Activity  . Alcohol use: No  . Drug use: No  . Sexual activity: Yes  Lifestyle  .  Physical activity:    Days per week: Not on file    Minutes per session: Not on file  . Stress: Not on file  Relationships  . Social connections:    Talks on phone: Not on file    Gets together: Not on file    Attends religious service: Not on file    Active member of club or organization: Not on file    Attends meetings of clubs or organizations: Not on file    Relationship status: Not on file  . Intimate partner violence:    Fear of current or ex partner: Not on file    Emotionally abused: Not on file    Physically abused: Not on file    Forced sexual activity: Not on file  Other Topics Concern  . Not on file  Social History Narrative  . Not on file   Family History  Problem Relation Age of Onset  . Hypertension Mother   . Hyperlipidemia Mother   . Hypertension Sister   . Alcohol abuse Father     Objective: Office vital signs reviewed. BP 138/87   Pulse 93   Temp 98.7 F (37.1 C) (Oral)   Ht 5' 9.5" (1.765 m)   Wt 196 lb 9.6 oz (89.2 kg)   BMI 28.62 kg/m   Physical  Examination:  General: Awake, alert, wel nourished, No acute distress Cardio: regular rate and rhythm, S1S2 heard, no murmurs appreciated Pulm: clear to auscultation bilaterally, no wheezes, rhonchi or rales; normal work of breathing on room air Extremities: warm, well perfused, No edema, cyanosis or clubbing; +2 pulses bilaterally; he has multiple varicose veins on her right lower extremity.  Assessment/ Plan: 37 y.o. male   1. Chronic anticoagulation INR subtherapeutic at 1.8 today.  I suspect this is related to having missed a few doses.  No changes to current regimen made since he has been therapeutic on this regimen previously.  I recommended that he follow-up with either this Friday or next Friday for recheck of INR and discussion of at home Coumadin monitoring with our pharmacist, Marcelino DusterMichelle.  I think that he would be a good candidate for this machine.  He seems well informed and has a good  understanding of his disease process. - CoaguChek XS/INR Waived   Orders Placed This Encounter  Procedures  . CoaguChek XS/INR Waived      Mialee Weyman Hulen SkainsM Aavya Shafer, DO [facility] [phone]

## 2018-08-26 ENCOUNTER — Ambulatory Visit: Payer: BLUE CROSS/BLUE SHIELD | Admitting: Family

## 2018-09-09 ENCOUNTER — Telehealth: Payer: Self-pay | Admitting: Family Medicine

## 2018-09-09 NOTE — Telephone Encounter (Signed)
Appointment scheduled for September 27 with Dr. Nadine CountsGottschalk. Patient states he can not come in any sooner.

## 2018-09-10 ENCOUNTER — Ambulatory Visit: Payer: BLUE CROSS/BLUE SHIELD | Admitting: Family

## 2018-09-17 ENCOUNTER — Ambulatory Visit: Payer: BLUE CROSS/BLUE SHIELD | Admitting: Family Medicine

## 2018-09-22 ENCOUNTER — Encounter: Payer: Self-pay | Admitting: Family Medicine

## 2018-10-04 ENCOUNTER — Ambulatory Visit: Payer: BLUE CROSS/BLUE SHIELD | Admitting: Family Medicine

## 2018-10-04 ENCOUNTER — Encounter: Payer: Self-pay | Admitting: Family Medicine

## 2018-10-04 VITALS — BP 128/87 | HR 74 | Temp 97.1°F | Ht 69.5 in | Wt 196.0 lb

## 2018-10-04 DIAGNOSIS — M62830 Muscle spasm of back: Secondary | ICD-10-CM

## 2018-10-04 DIAGNOSIS — M25562 Pain in left knee: Secondary | ICD-10-CM

## 2018-10-04 DIAGNOSIS — D6851 Activated protein C resistance: Secondary | ICD-10-CM

## 2018-10-04 MED ORDER — PREDNISONE 10 MG (21) PO TBPK: ORAL_TABLET | ORAL | 0 refills | Status: DC

## 2018-10-04 NOTE — Progress Notes (Signed)
Subjective: CC: KNee pain PCP: Elenora Gamma, MD ZOX:WRUEAVW TARRIS DELBENE is a 37 y.o. male presenting to clinic today for:  1. Knee pain Patient reports 46-month history of left-sided knee pain.  He points to the left superior aspect of the knee near the junction of the knee and thigh.  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  He denies any discoloration, swelling.  He does report pain with deep squatting or flexion of the left knee.  He has used ibuprofen in efforts to improve symptoms but this is not helping.  He feels that it is getting slightly worse now.  No sensation changes or sensation of instability.  2.  Right upper back pain Patient reports recent onset of right upper back pain.  He points to the base of the right side of the neck and into the right shoulder.  He states that he is using NSAID as above with little improvement.  Denies any weakness or numbness and tingling in the upper extremities.  No preceding injury.  3. Chronic anticoagulation Patient on chronic anticoagulation for factor V Leyden mutation.  He reports being mostly compliant with warfarin 10 mg daily with 15 mg doses on Thursdays.  He thinks he is remembered most of them but is not entirely sure.  He denies any abnormal bleeding, particularly in the setting of using NSAID as above.  No lower extremity swelling or concern for DVT at this time.  ROS: Per HPI  No Known Allergies Past Medical History:  Diagnosis Date  . Clotting disorder (HCC)   . Factor 5 Leiden mutation, heterozygous (HCC)   . Hypercholesterolemia   . Substance abuse (HCC)     Current Outpatient Medications:  .  valACYclovir (VALTREX) 1000 MG tablet, 2 tabs by mouth and repeat in 12 hours as needed for cold sores, Disp: 12 tablet, Rfl: 3 .  warfarin (COUMADIN) 10 MG tablet, Take 1 tablet (10 mg total) by mouth daily., Disp: 90 tablet, Rfl: 3 Social History    Socioeconomic History  . Marital status: Single    Spouse name: Not on file  . Number of children: Not on file  . Years of education: Not on file  . Highest education level: Not on file  Occupational History  . Not on file  Social Needs  . Financial resource strain: Not on file  . Food insecurity:    Worry: Not on file    Inability: Not on file  . Transportation needs:    Medical: Not on file    Non-medical: Not on file  Tobacco Use  . Smoking status: Current Every Day Smoker    Packs/day: 1.00    Years: 15.00    Pack years: 15.00  . Smokeless tobacco: Never Used  Substance and Sexual Activity  . Alcohol use: No  . Drug use: No  . Sexual activity: Yes  Lifestyle  . Physical activity:    Days per week: Not on file    Minutes per session: Not on file  . Stress: Not on file  Relationships  . Social connections:    Talks on phone: Not on file    Gets together: Not on file    Attends religious service: Not on file    Active member of club or organization: Not on file    Attends meetings of clubs or organizations: Not on file    Relationship status:  Not on file  . Intimate partner violence:    Fear of current or ex partner: Not on file    Emotionally abused: Not on file    Physically abused: Not on file    Forced sexual activity: Not on file  Other Topics Concern  . Not on file  Social History Narrative  . Not on file   Family History  Problem Relation Age of Onset  . Hypertension Mother   . Hyperlipidemia Mother   . Hypertension Sister   . Alcohol abuse Father     Objective: Office vital signs reviewed. BP 128/87 (BP Location: Left Arm)   Pulse 74   Temp (!) 97.1 F (36.2 C) (Oral)   Ht 5' 9.5" (1.765 m)   Wt 196 lb (88.9 kg)   BMI 28.53 kg/m   Physical Examination:  General: Awake, alert, well nourished, No acute distress Extremities: warm, well perfused, No edema, cyanosis or clubbing; +2 pulses bilaterally MSK: normal gait and station  Left  knee: Patient has full extension.  He has limited flexion with about a 15 degree loss.  No visible soft tissue swelling or bony abnormalities appreciated.  He has no tenderness to palpation to the patella, patellar tendon or quad tendon.  He does have tenderness to palpation along the distal lateral quad where it inserts along the lateral side of the knee.  There are no palpable deformities over this area.  No tenderness to palpation to the posterior popliteal fossa.  No palpable posterior popliteal masses.  No ligamentous laxity.  Thoracic spine: Patient has full active range of motion.  He has no midline tenderness to palpation.  He has mild tenderness to palpation along the right side of the trapezius.  Tone appears normal in this area. Skin: dry; intact; no rashes or lesions Neuro: 5/5 UE Strength and light touch sensation grossly intact  Assessment/ Plan: 37 y.o. male   1. Acute pain of left knee Likely a tendinitis.  I have prescribed him prednisone and instructed to discontinue use of oral NSAID given chronic anticoagulation with Coumadin.  We discussed that he may benefit from evaluation by orthopedics and he would like to go ahead and proceed with this referral.  Okay to continue rice remedies.  2. Factor V Leiden, prothrombin gene mutation [facility]) Not therapeutic with INR today of 1.5.  I have increased his Coumadin to reflect a Monday and Thursday dose of 15 mg with all other week days being 10 mg.  He will schedule follow-up for recheck in 1 week, at which time he will discuss with the pharmacist possibly getting a home monitor. - CoaguChek XS/INR Waived  3. Back spasm Will likely be relieved by oral prednisone.   Orders Placed This Encounter  Procedures  . CoaguChek XS/INR Waived  . Ambulatory referral to Orthopedic Surgery    Referral Priority:   Urgent    Referral Type:   Surgical    Referral Reason:   Specialty Services Required    Requested Specialty:   Orthopedic Surgery     Number of Visits Requested:   1   Meds ordered this encounter  Medications  . predniSONE (STERAPRED UNI-PAK 21 TAB) 10 MG (21) TBPK tablet    Sig: As directed x 6 days    Dispense:  21 tablet    Refill:  0     Avonna Iribe Hulen Skains, DO [facility] [phone]

## 2018-10-04 NOTE — Patient Instructions (Addendum)
Your INR was subtherapeutic at 1.5.  Increase your Coumadin to 15mg  (1.5 tablets) Mondays and Thursdays.  Continue 10mg  (1 tablet) daily all other days. See Marcelino Duster in 1 week for recheck. She can set up the INR machine for you.    For your knee and back, I have sent in prednisone. Don't use NSAIDs while on Coumadin.   Knee Pain, Adult Knee pain in adults is common. It can be caused by many things, including:  Arthritis.  A fluid-filled sac (cyst) or growth in your knee.  An infection in your knee.  An injury that will not heal.  Damage, swelling, or irritation of the tissues that support your knee.  Knee pain is usually not a sign of a serious problem. The pain may go away on its own with time and rest. If it does not, a health care provider may order tests to find the cause of the pain. These may include:  Imaging tests, such as an X-ray, MRI, or ultrasound.  Joint aspiration. In this test, fluid is removed from the knee.  Arthroscopy. In this test, a lighted tube is inserted into knee and an image is projected onto a TV screen.  A biopsy. In this test, a sample of tissue is removed from the body and studied under a microscope.  Follow these instructions at home: Pay attention to any changes in your symptoms. Take these actions to relieve your pain. Activity  Rest your knee.  Do not do things that cause pain or make pain worse.  Avoid high-impact activities or exercises, such as running, jumping rope, or doing jumping jacks. General instructions  Take over-the-counter and prescription medicines only as told by your health care provider.  Raise (elevate) your knee above the level of your heart when you are sitting or lying down.  Sleep with a pillow under your knee.  If directed, apply ice to the knee: ? Put ice in a plastic bag. ? Place a towel between your skin and the bag. ? Leave the ice on for 20 minutes, 2-3 times a day.  Ask your health care provider if you  should wear an elastic knee support.  Lose weight if you are overweight. Extra weight can put pressure on your knee.  Do not use any products that contain nicotine or tobacco, such as cigarettes and e-cigarettes. Smoking may slow the healing of any bone and joint problems that you may have. If you need help quitting, ask your health care provider. Contact a health care provider if:  Your knee pain continues, changes, or gets worse.  You have a fever along with knee pain.  Your knee buckles or locks up.  Your knee swells, and the swelling becomes worse. Get help right away if:  Your knee feels warm to the touch.  You cannot move your knee.  You have severe pain in your knee.  You have chest pain.  You have trouble breathing. Summary  Knee pain in adults is common. It can be caused by many things, including, arthritis, infection, cysts, or injury.  Knee pain is usually not a sign of a serious problem, but if it does not go away, a health care provider may perform tests to know the cause of the pain.  Pay attention to any changes in your symptoms. Relieve your pain with rest, medicines, light activity, and use of ice.  Get help if your pain continues or becomes very severe, or if your knee buckles or locks up, or  if you have chest pain or trouble breathing. This information is not intended to replace advice given to you by your health care provider. Make sure you discuss any questions you have with your health care provider. Document Released: 10/05/2007 Document Revised: 11/28/2016 Document Reviewed: 11/28/2016 Elsevier Interactive Patient Education  [employer].

## 2018-10-05 LAB — COAGUCHEK XS/INR WAIVED
INR: 1.5 — ABNORMAL HIGH (ref 0.9–1.1)
PROTHROMBIN TIME: 17.9 s

## 2018-10-20 ENCOUNTER — Ambulatory Visit: Payer: BLUE CROSS/BLUE SHIELD | Admitting: Pharmacist Clinician (PhC)/ Clinical Pharmacy Specialist

## 2018-10-20 ENCOUNTER — Ambulatory Visit (INDEPENDENT_AMBULATORY_CARE_PROVIDER_SITE_OTHER): Payer: Self-pay

## 2018-10-20 ENCOUNTER — Ambulatory Visit (INDEPENDENT_AMBULATORY_CARE_PROVIDER_SITE_OTHER): Payer: BLUE CROSS/BLUE SHIELD | Admitting: Orthopaedic Surgery

## 2018-10-20 ENCOUNTER — Other Ambulatory Visit (INDEPENDENT_AMBULATORY_CARE_PROVIDER_SITE_OTHER): Payer: Self-pay | Admitting: Radiology

## 2018-10-20 ENCOUNTER — Encounter (INDEPENDENT_AMBULATORY_CARE_PROVIDER_SITE_OTHER): Payer: Self-pay | Admitting: Orthopaedic Surgery

## 2018-10-20 VITALS — BP 129/71 | HR 76 | Wt 196.0 lb

## 2018-10-20 DIAGNOSIS — D6851 Activated protein C resistance: Secondary | ICD-10-CM

## 2018-10-20 DIAGNOSIS — G8929 Other chronic pain: Secondary | ICD-10-CM

## 2018-10-20 DIAGNOSIS — M25562 Pain in left knee: Principal | ICD-10-CM

## 2018-10-20 LAB — COAGUCHEK XS/INR WAIVED
INR: 2.1 — ABNORMAL HIGH (ref 0.9–1.1)
Prothrombin Time: 24.8 s

## 2018-10-20 NOTE — Progress Notes (Signed)
Office Visit Note   Patient: Adam Landry           Date of Birth: [birth year]           MRN: [id number] Visit Date: 10/20/2018              Requested by: Elenora Gamma, MD [address] [place], [place] [address] PCP: Elenora Gamma, MD   Assessment & Plan: Visit Diagnoses:  1. Chronic pain of left knee     Plan: Possible tear of either or lateral medial meniscus left knee.  Will order MRI scan.  Has had pain for 3 months without much relief  Follow-Up Instructions: Return after MRI left knee.   Orders:  Orders Placed This Encounter  Procedures  . XR KNEE 3 VIEW LEFT   No orders of the defined types were placed in this encounter.     Procedures: No procedures performed   Clinical Data: No additional findings.   Subjective: Chief Complaint  Patient presents with  . New Patient (Initial Visit)    L KNEE PAIN FOR 3 MO SWELLS TO WHERE HE CANT BEND IT, KNEE GAVE AWAY WHILE GETTING UP OUT OF CHAIR, PT FELL BUT DIDNT HURT KNEE, CLIMBS IN NA DOUT OF DITCHES AND BENDS A LOT AT WORK  37 year old gentleman with approximately 72-month history of left knee pain.  Adam Landry first noted onset while he was sitting on the ground and attempting to get up he felt his leg gave way.  Since that time he has had difficulty with flexing his knee and pain "about the knee".  Some tightness and some mild swelling.  He has had some trouble getting up and down tickly when he is at work trying to go up and down ladders or deep knee bends and squats.  He had tried a course of prednisone that helped for a while but had recurrent pain once the prednisone was discontinued.  This was not an on-the-job injury. History of Leiden 5 factor deficiency taking warfarin.  Has not had any bruising or ecchymosis or induration  HPI  Review of Systems  Constitutional: Negative for fatigue and fever.  HENT: Negative for ear pain.   Eyes: Negative for pain.  Respiratory: Negative for cough and  shortness of breath.   Cardiovascular: Positive for leg swelling.  Gastrointestinal: Negative for constipation and diarrhea.  Genitourinary: Negative for difficulty urinating.  Musculoskeletal: Negative for back pain and neck pain.  Skin: Negative for rash.  Allergic/Immunologic: Negative for food allergies.  Neurological: Positive for weakness. Negative for numbness.  Hematological: Does not bruise/bleed easily.  Psychiatric/Behavioral: Negative for sleep disturbance.     Objective: Vital Signs: BP 129/71 (BP Location: Left Arm, Patient Position: Sitting, Cuff Size: Normal)   Pulse 76   Wt 196 lb (88.9 kg)   BMI 28.53 kg/m   Physical Exam  Constitutional: He is oriented to person, place, and time. He appears well-developed and well-nourished.  HENT:  Mouth/Throat: Oropharynx is clear and moist.  Eyes: Pupils are equal, round, and reactive to light. EOM are normal.  Pulmonary/Chest: Effort normal.  Neurological: He is alert and oriented to person, place, and time.  Skin: Skin is warm and dry.  Psychiatric: He has a normal mood and affect. His behavior is normal.    Ortho Exam left knee exam with mild effusion.  There is both medial and lateral joint pain without popping or clicking.  Full flexion and extension without instability.  No  popliteal pain.  Does have more pain laterally than medially with flexion.  No calf pain.  No distal edema.  No localized tenderness about his left thigh.  Specialty Comments:  No specialty comments available.  Imaging: Xr Knee 3 View Left  Result Date: 10/20/2018 Films of the left knee demonstrate no acute changes.  No ectopic calcification.  Joint spaces are well-maintained without evidence of arthritis or fracture    PMFS History: Patient Active Problem List   Diagnosis Date Noted  . DVT of leg (deep venous thrombosis) (HCC) 01/28/2013  . Factor V Leiden, prothrombin gene mutation (HCC) 01/28/2013   Past Medical History:  Diagnosis  Date  . Clotting disorder (HCC)   . Factor 5 Leiden mutation, heterozygous (HCC)   . Hypercholesterolemia   . Substance abuse (HCC)     Family History  Problem Relation Age of Onset  . Hypertension Mother   . Hyperlipidemia Mother   . Hypertension Sister   . Alcohol abuse Father     Past Surgical History:  Procedure Laterality Date  . MULTIPLE TOOTH EXTRACTIONS  08/26/18   Social History   Occupational History  . Not on file  Tobacco Use  . Smoking status: Current Every Day Smoker    Packs/day: 1.00    Years: 15.00    Pack years: 15.00  . Smokeless tobacco: Never Used  Substance and Sexual Activity  . Alcohol use: No  . Drug use: No  . Sexual activity: Yes

## 2018-11-08 ENCOUNTER — Ambulatory Visit: Payer: BLUE CROSS/BLUE SHIELD | Admitting: Family Medicine

## 2018-11-08 VITALS — BP 151/96 | HR 92 | Temp 98.1°F | Ht 70.0 in | Wt 188.0 lb

## 2018-11-08 DIAGNOSIS — I809 Phlebitis and thrombophlebitis of unspecified site: Secondary | ICD-10-CM | POA: Diagnosis not present

## 2018-11-08 DIAGNOSIS — D6851 Activated protein C resistance: Secondary | ICD-10-CM

## 2018-11-08 DIAGNOSIS — F1910 Other psychoactive substance abuse, uncomplicated: Secondary | ICD-10-CM | POA: Diagnosis not present

## 2018-11-08 DIAGNOSIS — Z029 Encounter for administrative examinations, unspecified: Secondary | ICD-10-CM

## 2018-11-08 DIAGNOSIS — F1520 Other stimulant dependence, uncomplicated: Secondary | ICD-10-CM | POA: Insufficient documentation

## 2018-11-08 LAB — COAGUCHEK XS/INR WAIVED
INR: 1.2 — ABNORMAL HIGH (ref 0.9–1.1)
PROTHROMBIN TIME: 14.5 s

## 2018-11-08 NOTE — Progress Notes (Signed)
Subjective: CC: "mental health" PCP: [name], DO ZOX:WRUEAVW Adam Landry is a 37 y.o. male presenting to clinic today for:  1. Substance abuse Patient reports that he is currently using crystal meth, last use was 30 minutes ago.  He is injecting this.  He reports pain at the injection site of the right upper extremity.  Denies any associated erythema, fevers or purulence from the injection site.  He reports onset of substance use was at age 24.  He went to rehab in 2007 for alcohol abuse.  In 2009, he went into rehab for opiate use.  He has reached out to [facility] in [place], who takes his insurance, and plans to going to rehab soon.  He brings FMLA paperwork for this today.  Denies any SI, HI, visual or auditory hallucinations.  He does report increased energy secondary to drug use.  2.  Factor V Leiden Patient with history of factor V Leiden on chronic anticoagulation with Coumadin.  He reports compliance with every day 10 mg dosing of Coumadin with 1 day/week 15 mg dosing.  No shortness of breath or chest pain.  Substance use as above.   ROS: Per HPI  No Known Allergies Past Medical History:  Diagnosis Date  . Clotting disorder (HCC)   . Factor 5 Leiden mutation, heterozygous (HCC)   . Hypercholesterolemia   . Substance abuse (HCC)     Current Outpatient Medications:  .  warfarin (COUMADIN) 10 MG tablet, Take 1 tablet (10 mg total) by mouth daily., Disp: 90 tablet, Rfl: 3 Social History   Socioeconomic History  . Marital status: Single    Spouse name: Not on file  . Number of children: Not on file  . Years of education: Not on file  . Highest education level: Not on file  Occupational History  . Not on file  Social Needs  . Financial resource strain: Not on file  . Food insecurity:    Worry: Not on file    Inability: Not on file  . Transportation needs:    Medical: Not on file    Non-medical: Not on file  Tobacco Use  . Smoking status: Current  Every Day Smoker    Packs/day: 1.00    Years: 15.00    Pack years: 15.00  . Smokeless tobacco: Never Used  Substance and Sexual Activity  . Alcohol use: No  . Drug use: No  . Sexual activity: Yes  Lifestyle  . Physical activity:    Days per week: Not on file    Minutes per session: Not on file  . Stress: Not on file  Relationships  . Social connections:    Talks on phone: Not on file    Gets together: Not on file    Attends religious service: Not on file    Active member of club or organization: Not on file    Attends meetings of clubs or organizations: Not on file    Relationship status: Not on file  . Intimate partner violence:    Fear of current or ex partner: Not on file    Emotionally abused: Not on file    Physically abused: Not on file    Forced sexual activity: Not on file  Other Topics Concern  . Not on file  Social History Narrative  . Not on file   Family History  Problem Relation Age of Onset  . Hypertension Mother   . Hyperlipidemia Mother   . Hypertension Sister   .  Alcohol abuse Father     Objective: Office vital signs reviewed. BP (!) 151/96   Pulse 92   Temp 98.1 F (36.7 C) (Oral)   Ht 5\' 10"  (1.778 m)   Wt 188 lb (85.3 kg)   BMI 26.98 kg/m   Physical Examination:  General: Awake, alert, hyper, No acute distress HEENT: Normal    Eyes: PERRLA, extraocular membranes intact, sclera white    Throat: moist mucus membranes Cardio: regular rate and rhythm, S1S2 heard, no murmurs appreciated Pulm: clear to auscultation bilaterally, no wheezes, rhonchi or rales; normal work of breathing on room air Extremities: warm, well perfused, No edema, cyanosis or clubbing; +2 pulses bilaterally Skin: dry; intact; right cubital fossa without erythema, induration.  He does have mild tenderness to palpation a palpable knot over the vein in this area. Psych: Speech pressured, patient fidgety, thought somewhat tangential.  Does not appear to be responding to  internal stimuli.  Dep[facility] 11/08/2018 10/04/2018 08/16/2018  Decreased Interest 3 0 0  Down, Depressed, Hopeless 3 1 0  PHQ - 2 Score 6 1 0  Altered sleeping 3 - -  Tired, decreased energy 3 - -  Change in appetite 3 - -  Feeling bad or failure about yourself  3 - -  Trouble concentrating 3 - -  Moving slowly or fidgety/restless 0 - -  Suicidal thoughts 1 - -  PHQ-9 Score 22 - -  Difficult doing work/chores Very difficult - -   GAD 7 : Generalized Anxiety Score 11/08/2018  Nervous, Anxious, on Edge 3  Control/stop worrying 2  Worry too much - different things 1  Trouble relaxing 3  Restless 3  Easily annoyed or irritable 3  Afraid - awful might happen 3  Total GAD 7 Score 18  Anxiety Difficulty Extremely difficult   Mood questionnaire: Positive  Assessment/ Plan: 37 y.o. male   1. Substance abuse [facility](HCC) Currently [facility]hab [facility].  He has left FMLA paperwork upfront.  I have asked that he have them send me his records once he is released so that we can determine at what intervals he will need ongoing counseling and follow-up therapies.  Blood pressure is elevated today and I do suspect this is related to recent use of crystal methamphetamine.  I have also given him a handout on alternative sources for ongoing rehabilitation outpatient.  2. Phlebitis No evidence of secondary infection but palpable phlebitis noted in the right cubital fossa.  3. Factor V Leiden, prothrombin gene mutation (HCC) Subtherapeutic with INR of 1.2 today.  I suspect this is related to ongoing drug use. - CoaguChek XS/INR Waived    Orders Placed This Encounter  Procedures  . CoaguChek XS/INR Waived   No orders of the defined types were placed in this encounter.    Raliegh IpAshly M Gottschalk, DO [facility] [phone]

## 2018-11-09 ENCOUNTER — Telehealth (HOSPITAL_COMMUNITY): Payer: Self-pay | Admitting: Psychology

## 2018-11-10 ENCOUNTER — Other Ambulatory Visit: Payer: Self-pay | Admitting: Pharmacist Clinician (PhC)/ Clinical Pharmacy Specialist

## 2018-11-11 ENCOUNTER — Other Ambulatory Visit: Payer: Self-pay | Admitting: Pharmacist Clinician (PhC)/ Clinical Pharmacy Specialist

## 2018-11-11 ENCOUNTER — Telehealth (HOSPITAL_COMMUNITY): Payer: Self-pay | Admitting: Psychology

## 2018-11-12 ENCOUNTER — Encounter (HOSPITAL_COMMUNITY): Payer: Self-pay | Admitting: Psychology

## 2018-11-12 ENCOUNTER — Ambulatory Visit (INDEPENDENT_AMBULATORY_CARE_PROVIDER_SITE_OTHER): Payer: Self-pay | Admitting: Psychology

## 2018-11-12 ENCOUNTER — Telehealth: Payer: Self-pay | Admitting: *Deleted

## 2018-11-12 DIAGNOSIS — F1520 Other stimulant dependence, uncomplicated: Secondary | ICD-10-CM

## 2018-11-12 NOTE — Telephone Encounter (Signed)
Pt called in to request repeat INR Pt states he has large red area on arm that is swollen and warm to the touch and is fearful it may be blood clot Pt instructed to go to ER for evaluation
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
## 2018-11-16 ENCOUNTER — Telehealth (HOSPITAL_COMMUNITY): Payer: Self-pay | Admitting: Psychology
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
## 2018-11-20 DIAGNOSIS — S99921A Unspecified injury of right foot, initial encounter: Secondary | ICD-10-CM | POA: Diagnosis not present

## 2018-11-20 DIAGNOSIS — R52 Pain, unspecified: Secondary | ICD-10-CM | POA: Diagnosis not present

## 2018-11-20 DIAGNOSIS — M79671 Pain in right foot: Secondary | ICD-10-CM | POA: Diagnosis not present

## 2018-11-20 DIAGNOSIS — Z6826 Body mass index (BMI) 26.0-26.9, adult: Secondary | ICD-10-CM | POA: Diagnosis not present

## 2018-11-22 ENCOUNTER — Other Ambulatory Visit (HOSPITAL_COMMUNITY): Payer: BLUE CROSS/BLUE SHIELD | Attending: Psychiatry | Admitting: Psychology

## 2018-11-22 ENCOUNTER — Encounter: Payer: Self-pay | Admitting: Medical

## 2018-11-22 ENCOUNTER — Encounter (HOSPITAL_COMMUNITY): Payer: Self-pay | Admitting: Medical

## 2018-11-22 DIAGNOSIS — Z7901 Long term (current) use of anticoagulants: Secondary | ICD-10-CM | POA: Insufficient documentation

## 2018-11-22 DIAGNOSIS — D6851 Activated protein C resistance: Secondary | ICD-10-CM | POA: Insufficient documentation

## 2018-11-22 DIAGNOSIS — I808 Phlebitis and thrombophlebitis of other sites: Secondary | ICD-10-CM | POA: Diagnosis not present

## 2018-11-22 DIAGNOSIS — F1990 Other psychoactive substance use, unspecified, uncomplicated: Secondary | ICD-10-CM

## 2018-11-22 DIAGNOSIS — F1520 Other stimulant dependence, uncomplicated: Secondary | ICD-10-CM | POA: Insufficient documentation

## 2018-11-22 DIAGNOSIS — Z6372 Alcoholism and drug addiction in family: Secondary | ICD-10-CM | POA: Insufficient documentation

## 2018-11-22 DIAGNOSIS — F4312 Post-traumatic stress disorder, chronic: Secondary | ICD-10-CM | POA: Insufficient documentation

## 2018-11-22 DIAGNOSIS — IMO0001 Reserved for inherently not codable concepts without codable children: Secondary | ICD-10-CM | POA: Insufficient documentation

## 2018-11-22 DIAGNOSIS — F1910 Other psychoactive substance abuse, uncomplicated: Secondary | ICD-10-CM | POA: Diagnosis not present

## 2018-11-22 DIAGNOSIS — F17200 Nicotine dependence, unspecified, uncomplicated: Secondary | ICD-10-CM | POA: Insufficient documentation

## 2018-11-22 DIAGNOSIS — T7412XS Child physical abuse, confirmed, sequela: Secondary | ICD-10-CM

## 2018-11-22 DIAGNOSIS — F1020 Alcohol dependence, uncomplicated: Secondary | ICD-10-CM

## 2018-11-22 DIAGNOSIS — Z811 Family history of alcohol abuse and dependence: Secondary | ICD-10-CM

## 2018-11-22 DIAGNOSIS — S8992XD Unspecified injury of left lower leg, subsequent encounter: Secondary | ICD-10-CM

## 2018-11-22 DIAGNOSIS — Z8659 Personal history of other mental and behavioral disorders: Secondary | ICD-10-CM

## 2018-11-22 DIAGNOSIS — T7432XS Child psychological abuse, confirmed, sequela: Secondary | ICD-10-CM

## 2018-11-22 DIAGNOSIS — F1210 Cannabis abuse, uncomplicated: Secondary | ICD-10-CM | POA: Insufficient documentation

## 2018-11-22 DIAGNOSIS — T7491XS Unspecified adult maltreatment, confirmed, sequela: Secondary | ICD-10-CM

## 2018-11-22 MED ORDER — BACLOFEN 10 MG PO TABS: 10.0000 mg | ORAL_TABLET | Freq: Three times a day (TID) | ORAL | 2 refills | Status: DC
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
## 2018-11-24 ENCOUNTER — Telehealth (INDEPENDENT_AMBULATORY_CARE_PROVIDER_SITE_OTHER): Payer: Self-pay | Admitting: Orthopaedic Surgery

## 2018-11-24 ENCOUNTER — Other Ambulatory Visit (HOSPITAL_COMMUNITY): Payer: BLUE CROSS/BLUE SHIELD | Attending: Psychiatry | Admitting: Psychology

## 2018-11-24 ENCOUNTER — Other Ambulatory Visit (INDEPENDENT_AMBULATORY_CARE_PROVIDER_SITE_OTHER): Payer: Self-pay | Admitting: Orthopedic Surgery

## 2018-11-24 ENCOUNTER — Encounter (HOSPITAL_COMMUNITY): Payer: Self-pay | Admitting: Medical

## 2018-11-24 DIAGNOSIS — F17210 Nicotine dependence, cigarettes, uncomplicated: Secondary | ICD-10-CM | POA: Diagnosis not present

## 2018-11-24 DIAGNOSIS — F1910 Other psychoactive substance abuse, uncomplicated: Secondary | ICD-10-CM | POA: Insufficient documentation

## 2018-11-24 DIAGNOSIS — T7432XA Child psychological abuse, confirmed, initial encounter: Secondary | ICD-10-CM | POA: Insufficient documentation

## 2018-11-24 DIAGNOSIS — F4312 Post-traumatic stress disorder, chronic: Secondary | ICD-10-CM | POA: Insufficient documentation

## 2018-11-24 DIAGNOSIS — D6851 Activated protein C resistance: Secondary | ICD-10-CM | POA: Insufficient documentation

## 2018-11-24 DIAGNOSIS — E7800 Pure hypercholesterolemia, unspecified: Secondary | ICD-10-CM | POA: Insufficient documentation

## 2018-11-24 DIAGNOSIS — F1520 Other stimulant dependence, uncomplicated: Secondary | ICD-10-CM

## 2018-11-24 DIAGNOSIS — I809 Phlebitis and thrombophlebitis of unspecified site: Secondary | ICD-10-CM | POA: Insufficient documentation

## 2018-11-24 DIAGNOSIS — T7492XA Unspecified child maltreatment, confirmed, initial encounter: Secondary | ICD-10-CM | POA: Insufficient documentation

## 2018-11-24 DIAGNOSIS — F1990 Other psychoactive substance use, unspecified, uncomplicated: Secondary | ICD-10-CM

## 2018-11-24 DIAGNOSIS — S8992XD Unspecified injury of left lower leg, subsequent encounter: Secondary | ICD-10-CM | POA: Insufficient documentation

## 2018-11-24 DIAGNOSIS — Z79899 Other long term (current) drug therapy: Secondary | ICD-10-CM | POA: Insufficient documentation

## 2018-11-24 DIAGNOSIS — Z8659 Personal history of other mental and behavioral disorders: Secondary | ICD-10-CM | POA: Insufficient documentation

## 2018-11-24 DIAGNOSIS — Z62810 Personal history of physical and sexual abuse in childhood: Secondary | ICD-10-CM | POA: Diagnosis not present

## 2018-11-24 DIAGNOSIS — Z6372 Alcoholism and drug addiction in family: Secondary | ICD-10-CM | POA: Insufficient documentation

## 2018-11-24 DIAGNOSIS — Z7901 Long term (current) use of anticoagulants: Secondary | ICD-10-CM | POA: Diagnosis not present

## 2018-11-24 DIAGNOSIS — Z8249 Family history of ischemic heart disease and other diseases of the circulatory system: Secondary | ICD-10-CM | POA: Insufficient documentation

## 2018-11-24 DIAGNOSIS — I808 Phlebitis and thrombophlebitis of other sites: Secondary | ICD-10-CM | POA: Insufficient documentation

## 2018-11-24 DIAGNOSIS — F1020 Alcohol dependence, uncomplicated: Secondary | ICD-10-CM | POA: Insufficient documentation

## 2018-11-24 DIAGNOSIS — F1210 Cannabis abuse, uncomplicated: Secondary | ICD-10-CM | POA: Insufficient documentation

## 2018-11-24 HISTORY — DX: Personal history of other mental and behavioral disorders: Z86.59

## 2018-11-24 HISTORY — DX: Unspecified child maltreatment, confirmed, initial encounter: T74.92XA

## 2018-11-24 HISTORY — DX: Post-traumatic stress disorder, chronic: F43.12

## 2018-11-24 HISTORY — DX: Child psychological abuse, confirmed, initial encounter: T74.32XA

## 2018-11-24 HISTORY — DX: Unspecified injury of left lower leg, subsequent encounter: S89.92XD

## 2018-11-24 MED ORDER — DIAZEPAM 5 MG PO TABS: 5.0000 mg | ORAL_TABLET | Freq: Once | ORAL | 0 refills | Status: AC

## 2018-11-24 NOTE — Telephone Encounter (Signed)
Patient called stating he is scheduled for his MRI on 11/26/18.  Patient states he is "a little claustrophobic" and is requesting a prescription to help him during his scan [facility] on [address] in [place].

## 2018-11-24 NOTE — Telephone Encounter (Signed)
R sent for valium

## 2018-11-24 NOTE — Telephone Encounter (Signed)
Error

## 2018-11-24 NOTE — Telephone Encounter (Signed)
I called patient 

## 2018-11-24 NOTE — Telephone Encounter (Signed)
Please advise 

## 2018-11-25 ENCOUNTER — Encounter: Payer: Self-pay | Admitting: Medical

## 2018-11-25 ENCOUNTER — Other Ambulatory Visit (HOSPITAL_COMMUNITY): Payer: BLUE CROSS/BLUE SHIELD | Admitting: Psychology

## 2018-11-25 DIAGNOSIS — IMO0001 Reserved for inherently not codable concepts without codable children: Secondary | ICD-10-CM

## 2018-11-25 DIAGNOSIS — F1990 Other psychoactive substance use, unspecified, uncomplicated: Secondary | ICD-10-CM

## 2018-11-25 DIAGNOSIS — T7412XS Child physical abuse, confirmed, sequela: Secondary | ICD-10-CM

## 2018-11-25 DIAGNOSIS — F1520 Other stimulant dependence, uncomplicated: Secondary | ICD-10-CM

## 2018-11-25 DIAGNOSIS — F1020 Alcohol dependence, uncomplicated: Secondary | ICD-10-CM

## 2018-11-25 DIAGNOSIS — D6851 Activated protein C resistance: Secondary | ICD-10-CM

## 2018-11-26 ENCOUNTER — Ambulatory Visit
Admission: RE | Admit: 2018-11-26 | Discharge: 2018-11-26 | Disposition: A | Discharge status: Home / Self Care | Payer: Self-pay | Source: Ambulatory Visit | Attending: Orthopaedic Surgery | Admitting: Orthopaedic Surgery

## 2018-11-26 DIAGNOSIS — M25562 Pain in left knee: Principal | ICD-10-CM

## 2018-11-26 DIAGNOSIS — M23352 Other meniscus derangements, posterior horn of lateral meniscus, left knee: Secondary | ICD-10-CM | POA: Diagnosis not present

## 2018-11-26 DIAGNOSIS — G8929 Other chronic pain: Secondary | ICD-10-CM

## 2018-11-28 ENCOUNTER — Encounter (HOSPITAL_COMMUNITY): Payer: Self-pay | Admitting: Psychology

## 2018-11-28 NOTE — Progress Notes (Signed)
    Daily Group Progress Note  Program: CD-IOP   11/28/2018 Adam OvermanKenneth J Landry [id number]  Diagnosis: Methamphetamine use disorder, Severe   Sobriety Date: 12/1  Group Time: 1-2:30pm  Participation Level: Active  Behavioral Response: Appropriate and Sharing  Type of Therapy: Process Group  Interventions: Supportive  Topic: Process: The first half of group was spent in process. Members shared about any challenges or successes they had achieved in early recovery.  A new group member appeared for thee first session and he was asked to introduce himself and share about what had brought him to the program. Two drug tests were collected.  Group Time: 2:30-4pm  Participation Level: Active  Behavioral Response: Appropriate  Type of Therapy: Psycho-ed  Interventions: Strengthbased  Topic: Psycho-Ed. The second half of group was a psycho-ed on the developmental aspects of recovery and the four major stages or phases that one typically goes through. A handout and check list were provided, and members were given a few minutes to identify where they see themselves in this developmental process. Group members differed on where they were on the list and in the stages of recovery. Not surprisingly, however, most attributed themselves with being much farther along than they might be, considering they are very new or 'raw' in sobriety.   Summary: The patient reported he had gotten up and gone to the gym this morning at 5 am. He had attended a meeting last night and picked up a starter chip. He had also phoned his sponsor and talked at length yesterday evening. the patient pointed out these are all behaviors and include being accountable. He was energized and pleased with himself and offered support to his fellow group members and the newest member. The patient also shared that he had inquired about a men's prayer group at the church and plans to explore this group more soon. In the psycho-ed, he  seemed to vacillate from ambivalence to commitment. The patient noted that "I sometimes hide the past from others who are clean, but he also recognizes the importance of 'telling on myself'. The patient has all the 'lingo' and many right answers, but he has continued to use and seems to put himself in places, at times, that are very tempting. He responded well to this intervention.   UDS collected: Yes, pending  AA/NA attended? Yes, Wednesday evening  Sponsor?: yes, he reported securing a temporary sponsor   Adam Landry, LCAS 11/28/2018 6:21 PM

## 2018-11-29 ENCOUNTER — Encounter (INDEPENDENT_AMBULATORY_CARE_PROVIDER_SITE_OTHER): Payer: Self-pay | Admitting: Orthopaedic Surgery

## 2018-11-29 ENCOUNTER — Other Ambulatory Visit (HOSPITAL_COMMUNITY): Payer: BLUE CROSS/BLUE SHIELD | Admitting: Psychology

## 2018-11-29 ENCOUNTER — Ambulatory Visit (INDEPENDENT_AMBULATORY_CARE_PROVIDER_SITE_OTHER): Payer: BLUE CROSS/BLUE SHIELD | Admitting: Orthopaedic Surgery

## 2018-11-29 VITALS — BP 132/94 | HR 78 | Ht 71.0 in | Wt 200.0 lb

## 2018-11-29 DIAGNOSIS — F1520 Other stimulant dependence, uncomplicated: Secondary | ICD-10-CM

## 2018-11-29 DIAGNOSIS — M25562 Pain in left knee: Secondary | ICD-10-CM | POA: Diagnosis not present

## 2018-11-29 DIAGNOSIS — G8929 Other chronic pain: Secondary | ICD-10-CM | POA: Diagnosis not present
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
## 2018-12-01 ENCOUNTER — Telehealth (HOSPITAL_COMMUNITY): Payer: Self-pay | Admitting: Psychology

## 2018-12-01 ENCOUNTER — Other Ambulatory Visit (HOSPITAL_COMMUNITY): Payer: BLUE CROSS/BLUE SHIELD

## 2018-12-01 ENCOUNTER — Ambulatory Visit (INDEPENDENT_AMBULATORY_CARE_PROVIDER_SITE_OTHER): Payer: BLUE CROSS/BLUE SHIELD | Admitting: Pharmacist Clinician (PhC)/ Clinical Pharmacy Specialist

## 2018-12-01 DIAGNOSIS — D6851 Activated protein C resistance: Secondary | ICD-10-CM

## 2018-12-01 LAB — COAGUCHEK XS/INR WAIVED
INR: 1.9 — AB (ref 0.9–1.1)
PROTHROMBIN TIME: 22.7 s

## 2018-12-01 NOTE — Patient Instructions (Signed)
Description   Continue taking 10mg  daily except for 15mg  ( One 10mg  and 5mg  tablet)  on Thursdays.    INR today is 1.9 (goal is 2-3)  Slightly thick today

## 2018-12-02 ENCOUNTER — Encounter (HOSPITAL_COMMUNITY): Payer: Self-pay | Admitting: Psychology

## 2018-12-02 ENCOUNTER — Other Ambulatory Visit (HOSPITAL_COMMUNITY): Payer: Self-pay | Admitting: Medical

## 2018-12-02 ENCOUNTER — Other Ambulatory Visit (HOSPITAL_COMMUNITY): Payer: BLUE CROSS/BLUE SHIELD | Admitting: Psychology

## 2018-12-02 ENCOUNTER — Encounter: Payer: Self-pay | Admitting: Medical

## 2018-12-02 DIAGNOSIS — F1520 Other stimulant dependence, uncomplicated: Secondary | ICD-10-CM

## 2018-12-02 DIAGNOSIS — T7412XS Child physical abuse, confirmed, sequela: Secondary | ICD-10-CM

## 2018-12-02 MED ORDER — TRAZODONE HCL 50 MG PO TABS: 50.0000 mg | ORAL_TABLET | Freq: Every evening | ORAL | 0 refills | Status: DC | PRN

## 2018-12-02 NOTE — Progress Notes (Signed)
    Daily Group Progress Note  Program: CD-IOP   12/02/2018 Adam Landry [id number]  Diagnosis: Methamphetamine use disorder, severe  Sobriety Date: 11/21/18  Group Time: 1-2:30pm  Participation Level: Active  Behavioral Response: Sharing  Type of Therapy: Process Group  Interventions: Supportive  Topic: Process: The first part of group began with process. Group members shared their experiences in early recovery since we last met on Monday. One group member did not appear for group today or on Monday. We have not spoken with this group member since before the holiday weekend. One drug test was collected. Two group members met with the Medical Director for an initial meeting and medication check.   Group Time: 2:30-4pm  Participation Level: Active  Behavioral Response: Appropriate  Type of Therapy: Psycho-education Group  Interventions: CBT  Topic: Psycho-ed: Pro's and Cons of Using; the second half of group was spent in psycho-ed discussing the pro's and con's of using and not using. Group members were given a handout with four boxes, pro of using, con of using, proof not using, and con of not using. Group members were instructed to fill out as many things as they could think of in each box. After a few moments, a large version of the handout was drawn on the chalkboard and group members shared items from their lists to go in the respective boxes on the board. Group members processed which words or phrases stood out to them and shared their takeaways from seeing the pro's and con's lists presented side by side.   Summary: The patient reported that he had attended one NA meeting since we last met on Monday. The patient also reported that he picked up his starter chip. The patient shared that picking up the starter chip is important because it shows his commitment to sobriety.  The patient met with the Medical Director for a large portion of process to complete an initial  assessment.  The patient did not get a chance to check in during this portion of group. However, he was active and challenged his fellow group members during this part of process. During the psycho-ed portion of group, the patient shared items from his pros and cons lists. The patient shared that feeling happy is a trigger for him. He stated that when things are bad, it is easy for him to remember why he needs to stay sober, it is when things are going good that he starts to feel like "I got this," which leads to his relapse. The patient shared that the pro list for not using is "living life" and the rest of the lists are "death" and "not really living."    UDS collected: No  AA/NA attended?: YesTuesday  Sponsor?: Yes   Brandon Melnick, [place] 12/02/2018 9:50 AM

## 2018-12-03 ENCOUNTER — Telehealth (HOSPITAL_COMMUNITY): Payer: Self-pay | Admitting: Psychology

## 2018-12-06 ENCOUNTER — Other Ambulatory Visit (HOSPITAL_COMMUNITY): Payer: BLUE CROSS/BLUE SHIELD

## 2018-12-06 ENCOUNTER — Telehealth (HOSPITAL_COMMUNITY): Payer: Self-pay | Admitting: Psychology

## 2018-12-08 ENCOUNTER — Encounter (HOSPITAL_COMMUNITY): Payer: Self-pay | Admitting: Medical

## 2018-12-08 ENCOUNTER — Encounter (HOSPITAL_COMMUNITY): Payer: Self-pay | Admitting: Psychology

## 2018-12-08 ENCOUNTER — Telehealth (INDEPENDENT_AMBULATORY_CARE_PROVIDER_SITE_OTHER): Payer: Self-pay | Admitting: Orthopaedic Surgery

## 2018-12-08 ENCOUNTER — Other Ambulatory Visit (INDEPENDENT_AMBULATORY_CARE_PROVIDER_SITE_OTHER): Payer: BLUE CROSS/BLUE SHIELD | Admitting: Medical

## 2018-12-08 ENCOUNTER — Telehealth (HOSPITAL_COMMUNITY): Payer: Self-pay | Admitting: Psychology

## 2018-12-08 DIAGNOSIS — F1520 Other stimulant dependence, uncomplicated: Secondary | ICD-10-CM

## 2018-12-08 DIAGNOSIS — Z7901 Long term (current) use of anticoagulants: Secondary | ICD-10-CM

## 2018-12-08 DIAGNOSIS — F1020 Alcohol dependence, uncomplicated: Secondary | ICD-10-CM

## 2018-12-08 DIAGNOSIS — F1210 Cannabis abuse, uncomplicated: Secondary | ICD-10-CM

## 2018-12-08 DIAGNOSIS — Z6372 Alcoholism and drug addiction in family: Secondary | ICD-10-CM

## 2018-12-08 DIAGNOSIS — D6851 Activated protein C resistance: Secondary | ICD-10-CM

## 2018-12-08 DIAGNOSIS — F1990 Other psychoactive substance use, unspecified, uncomplicated: Secondary | ICD-10-CM

## 2018-12-08 DIAGNOSIS — F1910 Other psychoactive substance abuse, uncomplicated: Secondary | ICD-10-CM

## 2018-12-08 DIAGNOSIS — F4312 Post-traumatic stress disorder, chronic: Secondary | ICD-10-CM

## 2018-12-08 DIAGNOSIS — S8992XD Unspecified injury of left lower leg, subsequent encounter: Secondary | ICD-10-CM

## 2018-12-08 DIAGNOSIS — F17200 Nicotine dependence, unspecified, uncomplicated: Secondary | ICD-10-CM

## 2018-12-08 DIAGNOSIS — T7491XS Unspecified adult maltreatment, confirmed, sequela: Secondary | ICD-10-CM

## 2018-12-08 NOTE — Telephone Encounter (Signed)
thanks

## 2018-12-08 NOTE — Telephone Encounter (Signed)
Patient left a voicemail stating he needs to reschedule his surgery.  Patient requested a return call.

## 2018-12-08 NOTE — Progress Notes (Signed)
    Daily Group Progress Note  Program: CD-IOP   12/08/2018 [name] [id number]  Diagnosis:  Methamphetamine use disorder, severe, dependence ([place])   Sobriety Date: "used this morning"  Group Time: 1-2:30  Participation Level: Active  Behavioral Response: Appropriate and Sharing  Type of Therapy: Process Group  Interventions: CBT and Motivational Interviewing  Topic: Pts were active and engaged in group processing session. Counselor facilitated CBT-based processing questions to help pts discuss their recovery from mind-altering drugs/alcohol. Emphasis was placed on pts disclosing their challenges and successes pertaining to their treatment plan goals. UDS results were returned to some members and new samples were collected from some members. Two members met w/ Medical Director for f/u and discussion of MAT. One member returned after being [place] for one week.       Group Time: 2:30-4  Participation Level: Active  Behavioral Response: Appropriate and Sharing  Type of Therapy: Psycho-education Group  Interventions: Meditation: Chair Yoga  Topic: Patient were active and engaged in psychoeducation session led by Legrand Pitts, LPC. Clinician led a 1 hour mindfulness and chair yoga session. Pts participated actively and adapted each pose to fit their unique abilities. Pts were asked to share about their experience of chair yoga after completing session.     Summary: Pt was agitated, disheveled, and admitted to using over the weekend, multiple times. Pt ask for a Valium at his MRI appointment last week and knew he did not "need it" but, rather, only "wanted it". Pt then admitted to taking Percocet earlier that day but stated he did not "feel high" since it was a low dose. Pt stated he spent his weekend going to 4 NA meetings, his short term disability was denied for this tx, and he met w/ a friend from NA for a meal. Pt continues to present as flustered w/ pressured  speech, intermittent eye contact, and irritable mood. His thought process is tangential.    UDS collected: Yes Results: 12/5 Negative  AA/NA attended?: YesFriday, Saturday and Sunday  Sponsor?: No   Wes Suzanne Garbers, LPCA LCASA 12/08/2018 1:24 PM
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
## 2018-12-08 NOTE — Telephone Encounter (Signed)
Ok, thanks.

## 2018-12-08 NOTE — Telephone Encounter (Signed)
FYI

## 2018-12-09 ENCOUNTER — Telehealth (INDEPENDENT_AMBULATORY_CARE_PROVIDER_SITE_OTHER): Payer: Self-pay | Admitting: Orthopaedic Surgery

## 2018-12-09 ENCOUNTER — Other Ambulatory Visit (HOSPITAL_COMMUNITY): Payer: BLUE CROSS/BLUE SHIELD

## 2018-12-09 NOTE — Telephone Encounter (Signed)
Opened in error

## 2018-12-13 ENCOUNTER — Other Ambulatory Visit (HOSPITAL_COMMUNITY): Payer: BLUE CROSS/BLUE SHIELD

## 2018-12-20 ENCOUNTER — Encounter (INDEPENDENT_AMBULATORY_CARE_PROVIDER_SITE_OTHER): Payer: Self-pay | Admitting: Orthopaedic Surgery

## 2018-12-20 ENCOUNTER — Ambulatory Visit (INDEPENDENT_AMBULATORY_CARE_PROVIDER_SITE_OTHER): Payer: BLUE CROSS/BLUE SHIELD | Admitting: Orthopaedic Surgery

## 2018-12-20 VITALS — BP 123/80 | HR 89 | Resp 14 | Ht 71.0 in | Wt 200.0 lb

## 2018-12-20 DIAGNOSIS — G8929 Other chronic pain: Secondary | ICD-10-CM | POA: Diagnosis not present

## 2018-12-20 DIAGNOSIS — M25562 Pain in left knee: Secondary | ICD-10-CM

## 2018-12-20 MED ORDER — LIDOCAINE HCL 1 % IJ SOLN
2.0000 mL | INTRAMUSCULAR | Status: AC | PRN
  Administered 2018-12-20: 2 mL

## 2018-12-20 MED ORDER — METHYLPREDNISOLONE ACETATE 40 MG/ML IJ SUSP
80.0000 mg | INTRAMUSCULAR | Status: AC | PRN
  Administered 2018-12-20: 80 mg

## 2018-12-20 MED ORDER — BUPIVACAINE HCL 0.5 % IJ SOLN
2.0000 mL | INTRAMUSCULAR | Status: AC | PRN
  Administered 2018-12-20: 2 mL via INTRA_ARTICULAR
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
## 2018-12-29 ENCOUNTER — Encounter: Payer: Self-pay | Admitting: Pharmacist Clinician (PhC)/ Clinical Pharmacy Specialist

## 2018-12-30 ENCOUNTER — Encounter: Payer: Self-pay | Admitting: Family Medicine

## 2019-01-26 ENCOUNTER — Other Ambulatory Visit: Payer: Self-pay | Admitting: Pharmacist Clinician (PhC)/ Clinical Pharmacy Specialist

## 2019-03-10 ENCOUNTER — Other Ambulatory Visit: Payer: Self-pay

## 2019-03-10 ENCOUNTER — Ambulatory Visit: Payer: Self-pay | Admitting: Family Medicine

## 2019-03-10 ENCOUNTER — Encounter: Payer: Self-pay | Admitting: Family Medicine

## 2019-03-10 VITALS — BP 144/88 | HR 83 | Temp 97.5°F | Ht 71.0 in | Wt 205.6 lb

## 2019-03-10 DIAGNOSIS — J019 Acute sinusitis, unspecified: Secondary | ICD-10-CM

## 2019-03-10 NOTE — Progress Notes (Signed)
BP (!) 144/88   Pulse 83   Temp (!) 97.5 F (36.4 C) (Oral)   Ht 5\' 11"  (1.803 m)   Wt 205 lb 9.6 oz (93.3 kg)   SpO2 100%   BMI 28.68 kg/m    Subjective:    Patient ID: Adam Landry, male    DOB: October 31, 1981, 38 y.o.   MRN: [id number]  HPI: Adam Landry is a 38 y.o. male presenting on 03/10/2019 for Cough (x 1 day); Nasal Congestion; and sneezing   HPI Cough and nasal congestion and sneezing Patient comes in complaining of cough and nasal congestion and sneezing is been going on since the past day.  He says he was sent home from work because and they are concerned about the coronavirus at this point.  He does not have any body aches or shortness of breath or fevers or chills.  He thought it was allergies but they sent him home anyways for this.  He has not taken anything for it at this point.  He denies any sick contacts that he knows of for the coronavirus but did have somebody at work that is been sent home for flu for the past few days.  Relevant past medical, surgical, family and social history reviewed and updated as indicated. Interim medical history since our last visit reviewed. Allergies and medications reviewed and updated.  Review of Systems  Constitutional: Negative for chills and fever.  HENT: Positive for congestion, postnasal drip, rhinorrhea, sinus pressure, sneezing and sore throat. Negative for ear discharge, ear pain and voice change.   Eyes: Negative for pain, discharge, redness and visual disturbance.  Respiratory: Positive for cough. Negative for shortness of breath and wheezing.   Cardiovascular: Negative for chest pain and leg swelling.  Musculoskeletal: Negative for gait problem.  Skin: Negative for color change, rash and wound.  All other systems reviewed and are negative.   Per HPI unless specifically indicated above   Allergies as of 03/10/2019   No Known Allergies     Medication List       Accurate as of March 10, 2019  9:00 AM.  Always use your most recent med list.        baclofen 10 MG tablet Commonly known as:  LIORESAL Take 1 tablet (10 mg total) by mouth 3 (three) times daily.   diazepam 5 MG tablet Commonly known as:  VALIUM TAKE 1 TABLET BY MOUTH 1 2 HOURS PRIOR TO MRI (WILL NEED DRIVER TO MRI)   traZODone 50 MG tablet Commonly known as:  DESYREL Take 1 tablet (50 mg total) by mouth at bedtime as needed and may repeat dose one time if needed for sleep.   warfarin 5 MG tablet Commonly known as:  COUMADIN Take as directed by the anticoagulation clinic. If you are unsure how to take this medication, talk to your nurse or doctor. Original instructions:  TAKE 1 TABLET BY MOUTH EVERY THURSDAY   warfarin 10 MG tablet Commonly known as:  COUMADIN Take as directed by the anticoagulation clinic. If you are unsure how to take this medication, talk to your nurse or doctor. Original instructions:  TAKE 1 TABLET BY MOUTH ONCE DAILY          Objective:    BP (!) 144/88   Pulse 83   Temp (!) 97.5 F (36.4 C) (Oral)   Ht 5\' 11"  (1.803 m)   Wt 205 lb 9.6 oz (93.3 kg)   SpO2 100%   BMI  28.68 kg/m   Wt Readings from Last 3 Encounters:  03/10/19 205 lb 9.6 oz (93.3 kg)  12/20/18 200 lb (90.7 kg)  11/29/18 200 lb (90.7 kg)    Physical Exam Vitals signs and nursing note reviewed.  Constitutional:      General: He is not in acute distress.    Appearance: He is well-developed. He is not diaphoretic.  HENT:     Right Ear: Tympanic membrane, ear canal and external ear normal.     Left Ear: Tympanic membrane, ear canal and external ear normal.     Nose: Mucosal edema and rhinorrhea present.     Right Sinus: No maxillary sinus tenderness or frontal sinus tenderness.     Left Sinus: No maxillary sinus tenderness or frontal sinus tenderness.     Mouth/Throat:     Pharynx: Uvula midline. Posterior oropharyngeal erythema present. No oropharyngeal exudate.     Tonsils: No tonsillar abscesses.  Eyes:      General: No scleral icterus.       Right eye: No discharge.     Conjunctiva/sclera: Conjunctivae normal.     Pupils: Pupils are equal, round, and reactive to light.  Neck:     Musculoskeletal: Neck supple.     Thyroid: No thyromegaly.  Cardiovascular:     Rate and Rhythm: Normal rate and regular rhythm.     Heart sounds: Normal heart sounds. No murmur.  Pulmonary:     Effort: Pulmonary effort is normal. No respiratory distress.     Breath sounds: Normal breath sounds. No wheezing or rales.  Musculoskeletal: Normal range of motion.  Lymphadenopathy:     Cervical: No cervical adenopathy.  Skin:    General: Skin is warm and dry.     Findings: No rash.  Neurological:     Mental Status: He is alert and oriented to person, place, and time.     Coordination: Coordination normal.  Psychiatric:        Behavior: Behavior normal.         Assessment & Plan:   Problem List Items Addressed This Visit    None    Visit Diagnoses    Acute rhinosinusitis    -  Primary      Likely viral versus allergic at this point, recommended that patient is fine and does not have any symptoms of the coronavirus and can go back to work unless he develops any fevers or worsens for  Follow up plan: Return if symptoms worsen or fail to improve.  Counseling provided for all of the vaccine components No orders of the defined types were placed in this encounter.   Arville Care, MD [facility] 03/10/2019, 9:00 AM

## 2019-04-21 ENCOUNTER — Other Ambulatory Visit: Payer: Self-pay | Admitting: Family Medicine

## 2019-05-12 ENCOUNTER — Encounter: Payer: Self-pay | Admitting: Family

## 2019-05-12 ENCOUNTER — Other Ambulatory Visit: Payer: Self-pay

## 2019-05-12 ENCOUNTER — Ambulatory Visit (INDEPENDENT_AMBULATORY_CARE_PROVIDER_SITE_OTHER): Payer: Self-pay | Admitting: Family

## 2019-05-12 VITALS — BP 129/81 | HR 74 | Temp 97.2°F | Ht 71.0 in | Wt 212.0 lb

## 2019-05-12 DIAGNOSIS — M25512 Pain in left shoulder: Secondary | ICD-10-CM

## 2019-05-12 DIAGNOSIS — M542 Cervicalgia: Secondary | ICD-10-CM

## 2019-05-12 MED ORDER — BACLOFEN 10 MG PO TABS: 10.0000 mg | ORAL_TABLET | Freq: Three times a day (TID) | ORAL | 2 refills | Status: DC

## 2019-05-12 NOTE — Progress Notes (Signed)
Subjective:    Patient ID: Adam Landry, male    DOB: 05-09-1981, 38 y.o.   MRN: [id number]  Chief Complaint  Patient presents with  . left shoulder pain    Shoulder Pain   The pain is present in the neck and left shoulder. This is a new problem. The current episode started today. There has been no history of extremity trauma. The problem occurs intermittently. The problem has been waxing and waning. The quality of the pain is described as aching. The pain is at a severity of 2/10. The pain is moderate. Associated symptoms include stiffness. Pertinent negatives include no inability to bear weight. The symptoms are aggravated by activity. He has tried OTC ointments for the symptoms. The treatment provided mild relief.      Review of Systems  Musculoskeletal: Positive for stiffness.  All other systems reviewed and are negative.      Objective:   Physical Exam Vitals signs reviewed.  Constitutional:      General: He is not in acute distress.    Appearance: He is well-developed.  HENT:     Head: Normocephalic.  Eyes:     General:        Right eye: No discharge.        Left eye: No discharge.     Pupils: Pupils are equal, round, and reactive to light.  Neck:     Musculoskeletal: Normal range of motion and neck supple.     Thyroid: No thyromegaly.  Cardiovascular:     Rate and Rhythm: Normal rate and regular rhythm.     Heart sounds: Normal heart sounds. No murmur.  Pulmonary:     Effort: Pulmonary effort is normal. No respiratory distress.     Breath sounds: Normal breath sounds. No wheezing.  Abdominal:     General: Bowel sounds are normal. There is no distension.     Palpations: Abdomen is soft.     Tenderness: There is no abdominal tenderness.  Musculoskeletal: Normal range of motion.        General: No tenderness.     Comments: Full ROM  Skin:    General: Skin is warm and dry.     Findings: No erythema or rash.  Neurological:     Mental Status: He is  alert and oriented to person, place, and time.     Cranial Nerves: No cranial nerve deficit.     Deep Tendon Reflexes: Reflexes are normal and symmetric.  Psychiatric:        Behavior: Behavior normal.        Thought Content: Thought content normal.        Judgment: Judgment normal.      BP 129/81   Pulse 74   Temp (!) 97.2 F (36.2 C) (Oral)   Ht 5\' 11"  (1.803 m)   Wt 212 lb (96.2 kg)   BMI 29.57 kg/m       Assessment & Plan:  Adam OvermanKenneth J Shonk comes in today with chief complaint of left shoulder pain   Diagnosis and orders addressed:  1. Neck pain - baclofen (LIORESAL) 10 MG tablet; Take 1 tablet (10 mg total) by mouth 3 (three) times daily.  Dispense: 60 each; Refill: 2  2. Acute pain of left shoulder - baclofen (LIORESAL) 10 MG tablet; Take 1 tablet (10 mg total) by mouth 3 (three) times daily.  Dispense: 60 each; Refill: 2   Rest Ice  ROM exercises encouraged Will avoid NSAIDs because of warfarin  RTO if symptoms worsen or do not improve    Jannifer Rodney, FNP

## 2019-05-12 NOTE — Patient Instructions (Signed)

## 2019-06-09 ENCOUNTER — Other Ambulatory Visit: Payer: Self-pay

## 2019-06-10 ENCOUNTER — Encounter: Payer: Self-pay | Admitting: Family Medicine

## 2019-06-10 ENCOUNTER — Ambulatory Visit: Payer: BC Managed Care – PPO | Admitting: Family Medicine

## 2019-06-10 VITALS — BP 124/79 | HR 66 | Temp 97.2°F | Ht 71.0 in | Wt 211.0 lb

## 2019-06-10 DIAGNOSIS — H16049 Marginal corneal ulcer, unspecified eye: Secondary | ICD-10-CM | POA: Diagnosis not present

## 2019-06-10 DIAGNOSIS — D6851 Activated protein C resistance: Secondary | ICD-10-CM

## 2019-06-10 DIAGNOSIS — K644 Residual hemorrhoidal skin tags: Secondary | ICD-10-CM

## 2019-06-10 LAB — COAGUCHEK XS/INR WAIVED
INR: 2.1 — ABNORMAL HIGH (ref 0.9–1.1)
Prothrombin Time: 24.6 s

## 2019-06-10 LAB — POCT INR: INR: 2.1 (ref 2.0–3.0)

## 2019-06-10 MED ORDER — HYDROCORTISONE ACETATE 25 MG RE SUPP: 25.0000 mg | Freq: Two times a day (BID) | RECTAL | 0 refills | Status: DC

## 2019-06-10 NOTE — Progress Notes (Signed)
Subjective:  Patient ID: Adam Landry, male    DOB: 04/24/1981, 38 y.o.   MRN: [id number]  Chief Complaint:  Anticoagulation   HPI: Adam OvermanKenneth J Berzins is a 38 y.o. male presenting on 06/10/2019 for Anticoagulation  Pt here today for INR recheck. No bleeding, bruising, shortness of breath, palpitations, dizziness, headaches, weakness, or confusion. Has been taking medications as prescribed.  Pt also reports an external hemorrhoid. States this started several weeks ago. He states the area is pruritis and burns at times. States it is not tender touch. No purple or blue discoloration to site. He states he does have some bright red blood with wiping. No internal rectal pain.   Relevant past medical, surgical, family, and social history reviewed and updated as indicated.  Allergies and medications reviewed and updated.   Past Medical History:  Diagnosis Date  . Clotting disorder (HCC)   . Factor 5 Leiden mutation, heterozygous (HCC)   . Hypercholesterolemia   . Substance abuse [facility](HCC)     Past Surgical History:  Procedure Laterality Date  . MULTIPLE TOOTH EXTRACTIONS  08/26/18    Social History   Socioeconomic History  . Marital status: Single    Spouse name: Not on file  . Number of children: Not on file  . Years of education: Not on file  . Highest education level: Not on file  Occupational History  . Not on file  Social Needs  . Financial resource strain: Not on file  . Food insecurity    Worry: Not on file    Inability: Not on file  . Transportation needs    Medical: Not on file    Non-medical: Not on file  Tobacco Use  . Smoking status: Current Every Day Smoker    Packs/day: 1.00    Years: 15.00    Pack years: 15.00  . Smokeless tobacco: Never Used  Substance and Sexual Activity  . Alcohol use: Not Currently    Comment: See SA Chart  . Drug use: Not Currently    Types: Methamphetamines, Marijuana, Amphetamines    Comment: See SA Chart  . Sexual  activity: Yes  Lifestyle  . Physical activity    Days per week: Not on file    Minutes per session: Not on file  . Stress: Not on file  Relationships  . Social Musicianconnections    Talks on phone: Not on file    Gets together: Not on file    Attends religious service: Not on file    Active member of club or organization: Not on file    Attends meetings of clubs or organizations: Not on file    Relationship status: Not on file  . Intimate partner violence    Fear of current or ex partner: Not on file    Emotionally abused: Not on file    Physically abused: Not on file    Forced sexual activity: Not on file  Other Topics Concern  . Not on file  Social History Narrative  . Not on file    Outpatient Encounter Medications as of 06/10/2019  Medication Sig  . baclofen (LIORESAL) 10 MG tablet Take 1 tablet (10 mg total) by mouth 3 (three) times daily.  . hydrocortisone (ANUSOL-HC) 25 MG suppository Place 1 suppository (25 mg total) rectally 2 (two) times daily.  Marland Kitchen. warfarin (COUMADIN) 10 MG tablet Take 1 tablet by mouth once daily  . warfarin (COUMADIN) 5 MG tablet TAKE 1 TABLET BY MOUTH EVERY THURSDAY  No facility-administered encounter medications on file as of 06/10/2019.     No Known Allergies  Review of Systems  Constitutional: Negative for chills, fatigue and fever.  Respiratory: Negative for cough, chest tightness, shortness of breath and wheezing.   Cardiovascular: Negative for chest pain, palpitations and leg swelling.  Gastrointestinal: Positive for anal bleeding. Negative for abdominal distention, abdominal pain, blood in stool, constipation, diarrhea, nausea, rectal pain and vomiting.  Genitourinary: Negative for decreased urine volume and difficulty urinating.  Musculoskeletal: Negative for arthralgias and myalgias.  Neurological: Negative for dizziness, tremors, seizures, syncope, facial asymmetry, speech difficulty, weakness, light-headedness, numbness and headaches.   Hematological: Does not bruise/bleed easily.  Psychiatric/Behavioral: Negative for confusion.  All other systems reviewed and are negative.       Objective:  BP 124/79   Pulse 66   Temp (!) 97.2 F (36.2 C) (Oral)   Ht 5\' 11"  (1.803 m)   Wt 211 lb (95.7 kg)   BMI 29.43 kg/m    Wt Readings from Last 3 Encounters:  06/10/19 211 lb (95.7 kg)  05/12/19 212 lb (96.2 kg)  03/10/19 205 lb 9.6 oz (93.3 kg)    Physical Exam Vitals signs and nursing note reviewed.  Constitutional:      General: He is not in acute distress.    Appearance: Normal appearance. He is well-developed and well-groomed. He is not ill-appearing, toxic-appearing or diaphoretic.  HENT:     Head: Normocephalic and atraumatic.     Jaw: There is normal jaw occlusion.     Right Ear: Hearing normal.     Left Ear: Hearing normal.     Nose: Nose normal.     Mouth/Throat:     Lips: Pink.     Mouth: Mucous membranes are moist.     Pharynx: Oropharynx is clear. Uvula midline.  Eyes:     General: Lids are normal.     Extraocular Movements: Extraocular movements intact.     Conjunctiva/sclera: Conjunctivae normal.     Pupils: Pupils are equal, round, and reactive to light.  Neck:     Musculoskeletal: Normal range of motion and neck supple.     Thyroid: No thyroid mass, thyromegaly or thyroid tenderness.     Vascular: No carotid bruit or JVD.     Trachea: Trachea and phonation normal.  Cardiovascular:     Rate and Rhythm: Normal rate and regular rhythm.     Chest Wall: PMI is not displaced.     Pulses: Normal pulses.     Heart sounds: Normal heart sounds. No murmur. No friction rub. No gallop.   Pulmonary:     Effort: Pulmonary effort is normal. No respiratory distress.     Breath sounds: Normal breath sounds. No wheezing.  Abdominal:     General: Bowel sounds are normal. There is no distension or abdominal bruit.     Palpations: Abdomen is soft. There is no hepatomegaly or splenomegaly.     Tenderness:  There is no abdominal tenderness. There is no right CVA tenderness or left CVA tenderness.     Hernia: No hernia is present.  Genitourinary:    Comments: Deferred, pt declined.  Musculoskeletal: Normal range of motion.     Right lower leg: No edema.     Left lower leg: No edema.  Lymphadenopathy:     Cervical: No cervical adenopathy.  Skin:    General: Skin is warm and dry.     Capillary Refill: Capillary refill takes less than 2 seconds.  Coloration: Skin is not cyanotic, jaundiced or pale.     Findings: No rash.  Neurological:     General: No focal deficit present.     Mental Status: He is alert and oriented to person, place, and time.     Cranial Nerves: Cranial nerves are intact.     Sensory: Sensation is intact.     Motor: Motor function is intact.     Coordination: Coordination is intact.     Gait: Gait is intact.     Deep Tendon Reflexes: Reflexes are normal and symmetric.  Psychiatric:        Attention and Perception: Attention and perception normal.        Mood and Affect: Mood and affect normal.        Speech: Speech normal.        Behavior: Behavior normal. Behavior is cooperative.        Thought Content: Thought content normal.        Cognition and Memory: Cognition and memory normal.        Judgment: Judgment normal.     Results for orders placed or performed in visit on 06/10/19  POCT INR  Result Value Ref Range   INR 2.1 2.0 - 3.0       Pertinent labs & imaging results that were available during my care of the patient were reviewed by me and considered in my medical decision making.  Assessment & Plan:  Iantha FallenKenneth was seen today for anticoagulation.  Diagnoses and all orders for this visit:  Factor V Leiden, prothrombin gene mutation (HCC) INR 2.1, at goal. Continue current medication regimen, repeat INR in 4 weeks.  -     CoaguChek XS/INR Waived -     POCT INR  External hemorrhoids without complication Reported symptoms consistent with external  hemorrhoid. Has used OTC preparation H without relief of pruritis and burning. Symptomatic care discussed. Increase fiber and water intake to prevent hard stools. May need to add Miralax. Will treat with below. Pt aware to use for max of 7 days. Report any new or worsening symptoms.  -     hydrocortisone (ANUSOL-HC) 25 MG suppository; Place 1 suppository (25 mg total) rectally 2 (two) times daily.     Continue all other maintenance medications.  Follow up plan: Return in about 4 weeks (around 07/08/2019), or if symptoms worsen or fail to improve, for INR.    The above assessment and management plan was discussed with the patient. The patient verbalized understanding of and has agreed to the management plan. Patient is aware to call the clinic if symptoms persist or worsen. Patient is aware when to return to the clinic for a follow-up visit. Patient educated on when it is appropriate to go to the emergency department.   Kari BaarsMichelle Shavonne Ambroise, FNP-C [facility] [phone]

## 2019-06-15 DIAGNOSIS — H16041 Marginal corneal ulcer, right eye: Secondary | ICD-10-CM | POA: Diagnosis not present

## 2019-06-15 DIAGNOSIS — H1789 Other corneal scars and opacities: Secondary | ICD-10-CM | POA: Diagnosis not present

## 2019-06-22 ENCOUNTER — Telehealth: Payer: Self-pay | Admitting: Family Medicine

## 2019-06-22 NOTE — Telephone Encounter (Signed)
Patient states he is having dental surgery and his dentist has never worked with anyone with Factor 5 disease.  Patient states Dentist would like Dr. Darnell Level to call and speak with him about it to make sure he can do the surgery.  [place] [phone]

## 2019-06-23 NOTE — Telephone Encounter (Signed)
I called Dr Berdine Addison.  He actually referred patient to an oral surgeon and did not have any questions for me. There seems to be some confusion.  Please have the patient's specialist contact me if there are any questions.

## 2019-06-23 NOTE — Telephone Encounter (Signed)
[email or web address]

## 2019-06-24 ENCOUNTER — Other Ambulatory Visit: Payer: Self-pay

## 2019-06-24 ENCOUNTER — Ambulatory Visit: Payer: BC Managed Care – PPO | Admitting: Nurse Practitioner

## 2019-06-24 ENCOUNTER — Ambulatory Visit (INDEPENDENT_AMBULATORY_CARE_PROVIDER_SITE_OTHER): Payer: BC Managed Care – PPO | Admitting: Family Medicine

## 2019-06-24 ENCOUNTER — Encounter: Payer: Self-pay | Admitting: Family Medicine

## 2019-06-24 ENCOUNTER — Encounter: Payer: Self-pay | Admitting: Nurse Practitioner

## 2019-06-24 VITALS — BP 127/75 | HR 66 | Temp 97.1°F | Ht 71.0 in | Wt 208.0 lb

## 2019-06-24 DIAGNOSIS — B372 Candidiasis of skin and nail: Secondary | ICD-10-CM

## 2019-06-24 DIAGNOSIS — S76212A Strain of adductor muscle, fascia and tendon of left thigh, initial encounter: Secondary | ICD-10-CM | POA: Diagnosis not present

## 2019-06-24 MED ORDER — CLOTRIMAZOLE-BETAMETHASONE 1-0.05 % EX CREA: 1.0000 "application " | TOPICAL_CREAM | Freq: Two times a day (BID) | CUTANEOUS | 0 refills | Status: DC

## 2019-06-24 NOTE — Patient Instructions (Signed)

## 2019-06-24 NOTE — Progress Notes (Signed)
   Subjective:    Patient ID: Adam Landry, male    DOB: 06-14-1981, 38 y.o.   MRN: [id number]   Chief Complaint: raised area to groin area and Rectal Pain   HPI Patient was initially scheduled today for televisit. Dr. Livia Snellen spoke with him and told him he needed a face to face visit. He comes in today c/o rectal itching and excoriating. Has been going on for 2-3 months. He has had in the past. He also says that he was stretching this morning he developed a sharp pain in left pubic area.   Review of Systems  Constitutional: Negative.   Respiratory: Negative.   Cardiovascular: Negative.   Gastrointestinal: Positive for abdominal pain.  Genitourinary: Negative.   Skin: Positive for rash.  Neurological: Negative.   Psychiatric/Behavioral: Negative.   All other systems reviewed and are negative.      Objective:   Physical Exam Vitals signs and nursing note reviewed.  Constitutional:      Appearance: Normal appearance.  Cardiovascular:     Rate and Rhythm: Normal rate and regular rhythm.     Heart sounds: Normal heart sounds.  Pulmonary:     Breath sounds: Normal breath sounds.  Abdominal:     General: Abdomen is flat.  Genitourinary:    Scrotum/Testes: Normal.     Comments: Rectal area has whitish plaque with excoriation around anus and up anal cleft  No palpable mass in left groin area. Neurological:     General: No focal deficit present.     Mental Status: He is alert and oriented to person, place, and time.  Psychiatric:        Mood and Affect: Mood normal.        Behavior: Behavior normal.      BP 127/75   Pulse 66   Temp (!) 97.1 F (36.2 C) (Oral)   Ht 5\' 11"  (1.803 m)   Wt 208 lb (94.3 kg)   BMI 29.01 kg/m         Assessment & Plan:  Adam Landry in today with chief complaint of raised area to groin area and Rectal Pain   1. Cutaneous candidiasis Meds ordered this encounter  Medications  . clotrimazole-betamethasone (LOTRISONE)  cream    Sig: Apply 1 application topically 2 (two) times daily.    Dispense:  45 g    Refill:  0    Order Specific Question:   Supervising Provider    Answer:   Caryl Pina A [[id number]]   Keep area as clean and dry as possible Avoid scratching RTO prn  2. Groin strain, left, initial encounter Moist heat No heavy lifting motrin or tylenol OTC  Mary-Margaret Hassell Done, FNP

## 2019-06-24 NOTE — Progress Notes (Signed)
    Subjective:    Patient ID: Adam Landry, male    DOB: October 25, 1981, 38 y.o.   MRN: [id number]   HPI: Adam Landry is a 38 y.o. male presenting for rectal pain. Has a lesion he wants someone to look at.Wanted to send a picture, but he doesn't have maychart. I terminated the visit and asked that it be converted to a live visit. Since pt. Is already on location, in the parking lot, and in pain, I requested he be worked in with a provider.  Claretta Fraise, MD   "erroneous encounter"

## 2019-06-27 NOTE — Telephone Encounter (Signed)
Patient seen since phone call.  This encounter will now be closed 

## 2019-06-30 ENCOUNTER — Telehealth: Payer: Self-pay | Admitting: Family Medicine

## 2019-06-30 DIAGNOSIS — M542 Cervicalgia: Secondary | ICD-10-CM | POA: Diagnosis not present

## 2019-07-01 NOTE — Telephone Encounter (Signed)
Changed, pt aware.

## 2019-07-01 NOTE — Telephone Encounter (Signed)
Ok to move to 7/22.

## 2019-07-05 ENCOUNTER — Ambulatory Visit (INDEPENDENT_AMBULATORY_CARE_PROVIDER_SITE_OTHER): Payer: BC Managed Care – PPO | Admitting: Family Medicine

## 2019-07-05 ENCOUNTER — Encounter: Payer: Self-pay | Admitting: Family Medicine

## 2019-07-05 DIAGNOSIS — Z7189 Other specified counseling: Secondary | ICD-10-CM

## 2019-07-05 NOTE — Progress Notes (Signed)
Virtual Visit via telephone Note Due to COVID-19, visit is conducted virtually and was requested by patient. This visit type was conducted due to national recommendations for restrictions regarding the COVID-19 Pandemic (e.g. social distancing) in an effort to limit this patient's exposure and mitigate transmission in our community. All issues noted in this document were discussed and addressed.  A physical exam was not performed with this format.   I connected with Adam OvermanKenneth J Bethel on 07/05/19 at 1005 by telephone and verified that I am speaking with the correct person using two identifiers. Adam OvermanKenneth J Defibaugh is currently located at home and family is currently with them during visit. The provider, Kari BaarsMichelle Babe Anthis, FNP is located in their office at time of visit.  I discussed the limitations, risks, security and privacy concerns of performing an evaluation and management service by telephone and the availability of in person appointments. I also discussed with the patient that there may be a patient responsible charge related to this service. The patient expressed understanding and agreed to proceed.  Subjective:  Patient ID: Adam Landry, male    DOB: 1981-05-19, 38 y.o.   MRN: [id number]  Chief Complaint:  Advice Only   HPI: Adam OvermanKenneth J Pett is a 38 y.o. male presenting on 07/05/2019 for Advice Only   Pt reports he thought he was exposed to COVID-19. States it turns out his friend had a reaction to his blood pressure medications and his COVID-19 test was negative. Pt denies symptoms or other known sick contacts. Pt would like antibody testing. Pt aware [facility] is not completing antibody testing. Pt declines COVID-19 testing.     Relevant past medical, surgical, family, and social history reviewed and updated as indicated.  Allergies and medications reviewed and updated.   Past Medical History:  Diagnosis Date  . Clotting disorder (HCC)   . Factor 5 Leiden mutation,  heterozygous (HCC)   . Hypercholesterolemia   . Substance abuse [facility](HCC)     Past Surgical History:  Procedure Laterality Date  . MULTIPLE TOOTH EXTRACTIONS  08/26/18    Social History   Socioeconomic History  . Marital status: Single    Spouse name: Not on file  . Number of children: Not on file  . Years of education: Not on file  . Highest education level: Not on file  Occupational History  . Not on file  Social Needs  . Financial resource strain: Not on file  . Food insecurity    Worry: Not on file    Inability: Not on file  . Transportation needs    Medical: Not on file    Non-medical: Not on file  Tobacco Use  . Smoking status: Current Every Day Smoker    Packs/day: 1.00    Years: 15.00    Pack years: 15.00  . Smokeless tobacco: Never Used  Substance and Sexual Activity  . Alcohol use: Not Currently    Comment: See SA Chart  . Drug use: Not Currently    Types: Methamphetamines, Marijuana, Amphetamines    Comment: See SA Chart  . Sexual activity: Yes  Lifestyle  . Physical activity    Days per week: Not on file    Minutes per session: Not on file  . Stress: Not on file  Relationships  . Social Musicianconnections    Talks on phone: Not on file    Gets together: Not on file    Attends religious service: Not on file    Active member of club  or organization: Not on file    Attends meetings of clubs or organizations: Not on file    Relationship status: Not on file  . Intimate partner violence    Fear of current or ex partner: Not on file    Emotionally abused: Not on file    Physically abused: Not on file    Forced sexual activity: Not on file  Other Topics Concern  . Not on file  Social History Narrative  . Not on file    Outpatient Encounter Medications as of 07/05/2019  Medication Sig  . baclofen (LIORESAL) 10 MG tablet Take 1 tablet (10 mg total) by mouth 3 (three) times daily.  . clotrimazole-betamethasone (LOTRISONE) cream Apply 1 application topically 2  (two) times daily.  Marland Kitchen warfarin (COUMADIN) 10 MG tablet Take 1 tablet by mouth once daily  . warfarin (COUMADIN) 5 MG tablet TAKE 1 TABLET BY MOUTH EVERY THURSDAY   No facility-administered encounter medications on file as of 07/05/2019.     No Known Allergies  Review of Systems  Constitutional: Negative.   HENT: Negative.   Respiratory: Negative.   Cardiovascular: Negative.   Gastrointestinal: Negative.   Genitourinary: Negative.   Musculoskeletal: Negative.   Neurological: Negative.   Psychiatric/Behavioral: Negative.          Observations/Objective: No vital signs or physical exam, this was a telephone or virtual health encounter.  Pt alert and oriented, answers all questions appropriately, and able to speak in full sentences.    Assessment and Plan: Silvestre was seen today for advice only.  Diagnoses and all orders for this visit:  Advice Given About 2019 Novel Coronavirus Infection No known sick contacts. No recent travel. Asymptomatic. COVID-19 testing declined. Would like antibody testing. Pt aware [facility] is not offering antibody testing at this time. Pt aware if he develops symptoms or decides to be tested, to let [facility] know.     Follow Up Instructions: Return if symptoms worsen or fail to improve.    I discussed the assessment and treatment plan with the patient. The patient was provided an opportunity to ask questions and all were answered. The patient agreed with the plan and demonstrated an understanding of the instructions.   The patient was advised to call back or seek an in-person evaluation if the symptoms worsen or if the condition fails to improve as anticipated.  The above assessment and management plan was discussed with the patient. The patient verbalized understanding of and has agreed to the management plan. Patient is aware to call the clinic if symptoms persist or worsen. Patient is aware when to return to the clinic for a follow-up visit.  Patient educated on when it is appropriate to go to the emergency department.    I provided 10 minutes of non-face-to-face time during this encounter. The call started at 1005. The call ended at 1015. The other time was used for coordination of care.    Monia Pouch, FNP-C [facility] [address] [place], [place] [address] [phone]

## 2019-07-11 ENCOUNTER — Encounter: Payer: BC Managed Care – PPO | Admitting: Family Medicine

## 2019-07-11 ENCOUNTER — Ambulatory Visit: Payer: BC Managed Care – PPO | Admitting: Family Medicine

## 2019-07-12 ENCOUNTER — Other Ambulatory Visit: Payer: Self-pay

## 2019-07-13 ENCOUNTER — Encounter: Payer: Self-pay | Admitting: Family Medicine

## 2019-07-13 ENCOUNTER — Ambulatory Visit: Payer: BC Managed Care – PPO | Admitting: Family Medicine

## 2019-07-13 VITALS — BP 125/88 | HR 54 | Temp 97.8°F | Ht 71.0 in | Wt 212.0 lb

## 2019-07-13 DIAGNOSIS — D6851 Activated protein C resistance: Secondary | ICD-10-CM

## 2019-07-13 DIAGNOSIS — F1911 Other psychoactive substance abuse, in remission: Secondary | ICD-10-CM | POA: Diagnosis not present

## 2019-07-13 HISTORY — DX: Other psychoactive substance abuse, in remission: F19.11

## 2019-07-13 LAB — COAGUCHEK XS/INR WAIVED
INR: 2.1 — ABNORMAL HIGH (ref 0.9–1.1)
Prothrombin Time: 25.2 s

## 2019-07-13 NOTE — Progress Notes (Signed)
Subjective: CC: INR check PCP: Janora Norlander, DO GDJ:MEQASTM Adam Landry is a 38 y.o. male presenting to clinic today for:  1. Factor 5 leiden mutation Goal INR: 2-3 Current regimen: 10 mg daily except for Thursdays when he takes 15 mg daily.  Denies any abnormal bleeding episodes.  He has a dental procedure scheduled today and was told that he did not need to hold anticoagulation for this procedure.  He overall is feeling good.  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  He is 6 months sober.  He is employed now and is actively working with [place] to stay sober.  He does report weight gain but again reiterates that he is doing much better than her last visit.     ROS: Per HPI  No Known Allergies Past Medical History:  Diagnosis Date  . Clotting disorder ([place])   . Factor 5 Leiden mutation, heterozygous ([place])   . Hypercholesterolemia   . Substance abuse ([place])     Current Outpatient Medications:  .  baclofen (LIORESAL) 10 MG tablet, Take 1 tablet (10 mg total) by mouth 3 (three) times daily., Disp: 60 each, Rfl: 2 .  clotrimazole-betamethasone (LOTRISONE) cream, Apply 1 application topically 2 (two) times daily., Disp: 45 g, Rfl: 0 .  warfarin (COUMADIN) 10 MG tablet, Take 1 tablet by mouth once daily, Disp: 90 tablet, Rfl: 0 .  warfarin (COUMADIN) 5 MG tablet, TAKE 1 TABLET BY MOUTH EVERY THURSDAY, Disp: 30 tablet, Rfl: 0 Social History   Socioeconomic History  . Marital status: Single    Spouse name: Not on file  . Number of children: Not on file  . Years of education: Not on file  . Highest education level: Not on file  Occupational History  . Not on file  Social Needs  . Financial resource strain: Not on file  . Food insecurity    Worry: Not on file    Inability: Not on file  . Transportation needs    Medical: Not on file    Non-medical: Not on file  Tobacco Use  . Smoking status: Current Every Day Smoker    Packs/day: 1.00    Years: 15.00    Pack  years: 15.00  . Smokeless tobacco: Never Used  Substance and Sexual Activity  . Alcohol use: Not Currently    Comment: See SA Chart  . Drug use: Not Currently    Types: Methamphetamines, Marijuana, Amphetamines    Comment: See SA Chart  . Sexual activity: Yes  Lifestyle  . Physical activity    Days per week: Not on file    Minutes per session: Not on file  . Stress: Not on file  Relationships  . Social Herbalist on phone: Not on file    Gets together: Not on file    Attends religious service: Not on file    Active member of club or organization: Not on file    Attends meetings of clubs or organizations: Not on file    Relationship status: Not on file  . Intimate partner violence    Fear of current or ex partner: Not on file    Emotionally abused: Not on file    Physically abused: Not on file    Forced sexual activity: Not on file  Other Topics Concern  . Not on file  Social History Narrative  . Not on file   Family History  Problem Relation Age of Onset  . Hypertension  Mother   . Hyperlipidemia Mother   . Hypertension Sister   . Alcohol abuse Father     Objective: Office vital signs reviewed. BP 125/88   Pulse (!) 54   Temp 97.8 F (36.6 C) (Oral)   Ht 5\' 11"  (1.803 m)   Wt 212 lb (96.2 kg)   BMI 29.57 kg/m   Physical Examination:  General: Awake, alert, well nourished, No acute distress HEENT: Normal, Sclera white. Cardio: regular rate and rhythm, S1S2 heard, no murmurs appreciated Pulm: clear to auscultation bilaterally, no wheezes, rhonchi or rales; normal work of breathing on room air Psych: Mood stable, speech normal, affect appropriate, pleasant active  Assessment/ Plan: 38 y.o. male   1. Factor V Leiden, prothrombin gene mutation (HCC) INR is therapeutic at 2.1 today.  Continue current regimen.  Follow-up in 1 month, sooner if needed.  We discussed that if he is discharged on antibiotics by the orthodontist we may need to see each other  sooner for INR recheck. - CoaguChek XS/INR Waived  2. Substance abuse in remission [facility](HCC) Doing well.  He is 6 months sober.  He has good support and is involved in [employer].  I congratulated him on his remission.   Orders Placed This Encounter  Procedures  . CoaguChek XS/INR Waived   No orders of the defined types were placed in this encounter.    Raliegh IpAshly M , DO [facility] [phone]

## 2019-07-13 NOTE — Patient Instructions (Signed)
INR is at goal.  Continue current regimen.

## 2019-07-14 ENCOUNTER — Ambulatory Visit (INDEPENDENT_AMBULATORY_CARE_PROVIDER_SITE_OTHER): Payer: BC Managed Care – PPO | Admitting: Family Medicine

## 2019-07-14 ENCOUNTER — Other Ambulatory Visit: Payer: Self-pay

## 2019-07-14 ENCOUNTER — Encounter: Payer: Self-pay | Admitting: Family Medicine

## 2019-07-14 VITALS — BP 129/80 | HR 55 | Temp 98.9°F | Ht 71.0 in | Wt 213.0 lb

## 2019-07-14 DIAGNOSIS — D6851 Activated protein C resistance: Secondary | ICD-10-CM | POA: Diagnosis not present

## 2019-07-14 DIAGNOSIS — E782 Mixed hyperlipidemia: Secondary | ICD-10-CM | POA: Diagnosis not present

## 2019-07-14 DIAGNOSIS — Z0001 Encounter for general adult medical examination with abnormal findings: Secondary | ICD-10-CM | POA: Diagnosis not present

## 2019-07-14 DIAGNOSIS — F1911 Other psychoactive substance abuse, in remission: Secondary | ICD-10-CM

## 2019-07-14 DIAGNOSIS — Z0000 Encounter for general adult medical examination without abnormal findings: Secondary | ICD-10-CM

## 2019-07-14 NOTE — Patient Instructions (Signed)
During your next INR check, we will draw your fasting labs.  Make sure to tell the phlebotomist that you need to have the other labs done so they do a venopuncture instead of a fingerstick.  Health Maintenance, Male Adopting a healthy lifestyle and getting preventive care are important in promoting health and wellness. Ask your health care provider about:  The right schedule for you to have regular tests and exams.  Things you can do on your own to prevent diseases and keep yourself healthy. What should I know about diet, weight, and exercise? Eat a healthy diet   Eat a diet that includes plenty of vegetables, fruits, low-fat dairy products, and lean protein.  Do not eat a lot of foods that are high in solid fats, added sugars, or sodium. Maintain a healthy weight Body mass index (BMI) is a measurement that can be used to identify possible weight problems. It estimates body fat based on height and weight. Your health care provider can help determine your BMI and help you achieve or maintain a healthy weight. Get regular exercise Get regular exercise. This is one of the most important things you can do for your health. Most adults should:  Exercise for at least 150 minutes each week. The exercise should increase your heart rate and make you sweat (moderate-intensity exercise).  Do strengthening exercises at least twice a week. This is in addition to the moderate-intensity exercise.  Spend less time sitting. Even light physical activity can be beneficial. Watch cholesterol and blood lipids Have your blood tested for lipids and cholesterol at 39 years of age, then have this test every 5 years. You may need to have your cholesterol levels checked more often if:  Your lipid or cholesterol levels are high.  You are older than 39 years of age.  You are at high risk for heart disease. What should I know about cancer screening? Many types of cancers can be detected early and may often be  prevented. Depending on your health history and family history, you may need to have cancer screening at various ages. This may include screening for:  Colorectal cancer.  Prostate cancer.  Skin cancer.  Lung cancer. What should I know about heart disease, diabetes, and high blood pressure? Blood pressure and heart disease  High blood pressure causes heart disease and increases the risk of stroke. This is more likely to develop in people who have high blood pressure readings, are of African descent, or are overweight.  Talk with your health care provider about your target blood pressure readings.  Have your blood pressure checked: ? Every 3-5 years if you are 33-24 years of age. ? Every year if you are 4 years old or older.  If you are between the ages of 23 and 69 and are a current or former smoker, ask your health care provider if you should have a one-time screening for abdominal aortic aneurysm (AAA). Diabetes Have regular diabetes screenings. This checks your fasting blood sugar level. Have the screening done:  Once every three years after age 73 if you are at a normal weight and have a low risk for diabetes.  More often and at a younger age if you are overweight or have a high risk for diabetes. What should I know about preventing infection? Hepatitis B If you have a higher risk for hepatitis B, you should be screened for this virus. Talk with your health care provider to find out if you are at risk for  hepatitis B infection. Hepatitis C Blood testing is recommended for:  Everyone born from [birth year] through 1966.  Anyone with known risk factors for hepatitis C. Sexually transmitted infections (STIs)  You should be screened each year for STIs, including gonorrhea and chlamydia, if: ? You are sexually active and are younger than 39 years of age. ? You are older than 39 years of age and your health care provider tells you that you are at risk for this type of infection. ?  Your sexual activity has changed since you were last screened, and you are at increased risk for chlamydia or gonorrhea. Ask your health care provider if you are at risk.  Ask your health care provider about whether you are at high risk for HIV. Your health care provider may recommend a prescription medicine to help prevent HIV infection. If you choose to take medicine to prevent HIV, you should first get tested for HIV. You should then be tested every 3 months for as long as you are taking the medicine. Follow these instructions at home: Lifestyle  Do not use any products that contain nicotine or tobacco, such as cigarettes, e-cigarettes, and chewing tobacco. If you need help quitting, ask your health care provider.  Do not use street drugs.  Do not share needles.  Ask your health care provider for help if you need support or information about quitting drugs. Alcohol use  Do not drink alcohol if your health care provider tells you not to drink.  If you drink alcohol: ? Limit how much you have to 0-2 drinks a day. ? Be aware of how much alcohol is in your drink. In the [place] one drink equals one 12 oz bottle of beer (355 mL), one 5 oz glass of wine (148 mL), or one 1 oz glass of hard liquor (44 mL). General instructions  Schedule regular health, dental, and eye exams.  Stay current with your vaccines.  Tell your health care provider if: ? You often feel depressed. ? You have ever been abused or do not feel safe at home. Summary  Adopting a healthy lifestyle and getting preventive care are important in promoting health and wellness.  Follow your health care provider's instructions about healthy diet, exercising, and getting tested or screened for diseases.  Follow your health care provider's instructions on monitoring your cholesterol and blood pressure. This information is not intended to replace advice given to you by your health care provider. Make sure you discuss any  questions you have with your health care provider. Document Released: 06/05/2008 Document Revised: 12/01/2018 Document Reviewed: 12/01/2018 Elsevier Patient Education  2020 [employer]

## 2019-07-14 NOTE — Progress Notes (Signed)
Adam Landry is a 38 y.o. male presents to office today for annual physical exam examination.    Concerns today include: 1. none  Occupation: Clinical biochemist, Substance use: in remission x7mDiet: fair, Exercise: active at work Last eye exam: regular Last dental exam: regular Refills needed today: n/a Immunizations needed: UTD  Past Medical History:  Diagnosis Date  . [place]   . Factor 5 Leiden mutation, heterozygous ([place]   . Hypercholesterolemia   . Substance abuse ([place]    Social History   Socioeconomic History  . Marital status: Single    Spouse name: Not on file  . Number of children: Not on file  . Years of education: Not on file  . Highest education level: Not on file  Occupational History  . Not on file  Social Needs  . Financial resource strain: Not on file  . Food insecurity    Worry: Not on file    Inability: Not on file  . Transportation needs    Medical: Not on file    Non-medical: Not on file  Tobacco Use  . Smoking status: Current Every Day Smoker    Packs/day: 1.00    Years: 15.00    Pack years: 15.00  . Smokeless tobacco: Never Used  Substance and Sexual Activity  . Alcohol use: Not Currently    Comment: See SA Chart  . Drug use: Not Currently    Types: Methamphetamines, Marijuana, Amphetamines    Comment: See SA Chart  . Sexual activity: Yes  Lifestyle  . Physical activity    Days per week: Not on file    Minutes per session: Not on file  . Stress: Not on file  Relationships  . Social cHerbaliston phone: Not on file    Gets together: Not on file    Attends religious service: Not on file    Active member of club or organization: Not on file    Attends meetings of clubs or organizations: Not on file    Relationship status: Not on file  . Intimate partner violence    Fear of current or ex partner: Not on file    Emotionally abused: Not on file    Physically abused: Not on file    Forced sexual  activity: Not on file  Other Topics Concern  . Not on file  Social History Narrative  . Not on file   Past Surgical History:  Procedure Laterality Date  . MULTIPLE TOOTH EXTRACTIONS  08/26/18   Family History  Problem Relation Age of Onset  . Hypertension Mother   . Hyperlipidemia Mother   . Hypertension Sister   . Alcohol abuse Father     Current Outpatient Medications:  .  clotrimazole-betamethasone (LOTRISONE) cream, Apply 1 application topically 2 (two) times daily., Disp: 45 g, Rfl: 0 .  warfarin (COUMADIN) 10 MG tablet, Take 1 tablet by mouth once daily, Disp: 90 tablet, Rfl: 0 .  warfarin (COUMADIN) 5 MG tablet, TAKE 1 TABLET BY MOUTH EVERY THURSDAY (Patient taking differently: Take on Thursday in addition to 175m, Disp: 30 tablet, Rfl: 0 .  baclofen (LIORESAL) 10 MG tablet, Take 1 tablet (10 mg total) by mouth 3 (three) times daily. (Patient not taking: Reported on 07/14/2019), Disp: 60 each, Rfl: 2  No Known Allergies   ROS: Review of Systems Constitutional: negative Eyes: positive for contacts/glasses Ears, nose, mouth, throat, and face: negative Respiratory: negative Cardiovascular: negative Gastrointestinal: hx chaffing in past  but normal now Genitourinary:negative Integument/breast: negative Hematologic/lymphatic: negative Musculoskeletal:positive for left knee pain (seeing chiropractor) Neurological: negative Behavioral/Psych: positive for OCD behaviors Endocrine: negative Allergic/Immunologic: negative    Physical exam BP 129/80   Pulse (!) 55   Temp 98.9 F (37.2 C) (Oral)   Ht 5' 11"  (1.803 m)   Wt 213 lb (96.6 kg)   BMI 29.71 kg/m  General appearance: alert, cooperative, appears stated age and no distress Head: Normocephalic, without obvious abnormality, atraumatic Eyes: negative findings: lids and lashes normal, conjunctivae and sclerae normal, corneas clear and pupils equal, round, reactive to light and accomodation Ears: normal TM's and  external ear canals both ears Nose: Nares normal. Septum midline. Mucosa normal. No drainage or sinus tenderness. Throat: lips, mucosa, and tongue normal; teeth and gums normal and tobacco staining of tongue noted Neck: no adenopathy, supple, symmetrical, trachea midline and thyroid not enlarged, symmetric, no tenderness/mass/nodules Back: symmetric, no curvature. ROM normal. No CVA tenderness. Lungs: clear to auscultation bilaterally Chest wall: no tenderness Heart: regular rate and rhythm, S1, S2 normal, no murmur, click, rub or gallop Abdomen: mild left sided abdominal TTP, no peritoneal signs. Extremities: extremities normal, atraumatic, no cyanosis or edema Pulses: 2+ and symmetric Skin: Skin color, texture, turgor normal. No rashes or lesions or a few bruises noted along LEs. several benign appearing nevi along back Lymph nodes: Cervical, supraclavicular, and axillary nodes normal. Neuro: No focal neurologic deficits.  Patellar DTRs 1 out of 4 bilaterally. Psych: Mood stable, speech normal Depression screen [facility] 2/9 07/14/2019 07/13/2019 06/24/2019  Decreased Interest 0 0 0  Down, Depressed, Hopeless 0 0 0  PHQ - 2 Score 0 0 0  Altered sleeping 0 0 -  Tired, decreased energy 0 0 -  Change in appetite 0 0 -  Feeling bad or failure about yourself  - 0 -  Trouble concentrating 0 0 -  Moving slowly or fidgety/restless 0 0 -  Suicidal thoughts 0 0 -  PHQ-9 Score 0 0 -  Difficult doing work/chores - - -  Some encounter information is confidential and restricted. Go to Review Flowsheets activity to see all data.    Assessment/ Plan: [name] here for annual physical exam.   1. Annual physical exam  2. Mixed hyperlipidemia Patient to return in 1 month for fasting lipid panel along with INR check - CMP14+EGFR; Future - Lipid Panel; Future  3. Substance abuse in remission [name]) Doing well.  Involved in supportive group.  Check labs at next visit - CBC; Future -  CMP14+EGFR; Future - Vitamin B12; Future  4. Factor V Leiden, prothrombin gene mutation ([name]) Had therapeutic INR yesterday.  Doing well today and having no substantial bleeding status post dental procedure.  He is not on any antibiotics status post dental procedure. - CBC; Future - CMP14+EGFR; Future   Handout provided on healthy lifestyle choices, including diet (rich in fruits, vegetables and lean meats and low in salt and simple carbohydrates) and exercise (at least 30 minutes of moderate physical activity daily).  Patient to follow up in 1 year for annual exam or sooner if needed.   M. Lajuana Ripple, DO

## 2019-07-28 ENCOUNTER — Other Ambulatory Visit: Payer: Self-pay

## 2019-07-28 ENCOUNTER — Ambulatory Visit: Payer: BC Managed Care – PPO | Admitting: Family Medicine

## 2019-07-28 ENCOUNTER — Other Ambulatory Visit: Payer: BC Managed Care – PPO

## 2019-07-28 ENCOUNTER — Encounter: Payer: Self-pay | Admitting: Family Medicine

## 2019-07-28 DIAGNOSIS — E782 Mixed hyperlipidemia: Secondary | ICD-10-CM

## 2019-07-28 DIAGNOSIS — D6851 Activated protein C resistance: Secondary | ICD-10-CM

## 2019-07-28 DIAGNOSIS — R6889 Other general symptoms and signs: Secondary | ICD-10-CM | POA: Diagnosis not present

## 2019-07-28 DIAGNOSIS — F1911 Other psychoactive substance abuse, in remission: Secondary | ICD-10-CM

## 2019-07-29 LAB — CBC
Hematocrit: 43.4 % (ref 37.5–51.0)
Hemoglobin: 14.4 g/dL (ref 13.0–17.7)
MCH: 30.8 pg (ref 26.6–33.0)
MCHC: 33.2 g/dL (ref 31.5–35.7)
MCV: 93 fL (ref 79–97)
Platelets: 292 10*3/uL (ref 150–450)
RBC: 4.68 x10E6/uL (ref 4.14–5.80)
RDW: 13.2 % (ref 11.6–15.4)
WBC: 5.8 10*3/uL (ref 3.4–10.8)

## 2019-07-29 LAB — CMP14+EGFR
ALT: 26 IU/L (ref 0–44)
AST: 22 IU/L (ref 0–40)
Albumin/Globulin Ratio: 2.2 (ref 1.2–2.2)
Albumin: 4.4 g/dL (ref 4.0–5.0)
Alkaline Phosphatase: 61 IU/L (ref 39–117)
BUN/Creatinine Ratio: 19 (ref 9–20)
BUN: 18 mg/dL (ref 6–20)
Bilirubin Total: 0.3 mg/dL (ref 0.0–1.2)
CO2: 22 mmol/L (ref 20–29)
Calcium: 9.7 mg/dL (ref 8.7–10.2)
Chloride: 105 mmol/L (ref 96–106)
Creatinine, Ser: 0.95 mg/dL (ref 0.76–1.27)
GFR calc Af Amer: 117 mL/min/{1.73_m2} (ref >59)
GFR calc non Af Amer: 101 mL/min/{1.73_m2} (ref >59)
Globulin, Total: 2 g/dL (ref 1.5–4.5)
Glucose: 97 mg/dL (ref 65–99)
Potassium: 4.6 mmol/L (ref 3.5–5.2)
Sodium: 140 mmol/L (ref 134–144)
Total Protein: 6.4 g/dL (ref 6.0–8.5)

## 2019-07-29 LAB — LIPID PANEL
Chol/HDL Ratio: 4.3 ratio (ref 0.0–5.0)
Cholesterol, Total: 229 mg/dL — ABNORMAL HIGH (ref 100–199)
HDL: 53 mg/dL (ref >39)
LDL Calculated: 162 mg/dL — ABNORMAL HIGH (ref 0–99)
Triglycerides: 71 mg/dL (ref 0–149)
VLDL Cholesterol Cal: 14 mg/dL (ref 5–40)

## 2019-07-29 LAB — VITAMIN B12: Vitamin B-12: 401 pg/mL (ref 232–1245)

## 2019-10-01 ENCOUNTER — Other Ambulatory Visit: Payer: Self-pay | Admitting: Physician Assistant

## 2019-10-25 ENCOUNTER — Encounter: Payer: Self-pay | Admitting: Orthopedic Surgery

## 2019-10-25 ENCOUNTER — Ambulatory Visit: Payer: Self-pay

## 2019-10-25 ENCOUNTER — Ambulatory Visit: Payer: BC Managed Care – PPO | Admitting: Orthopedic Surgery

## 2019-10-25 ENCOUNTER — Other Ambulatory Visit: Payer: Self-pay

## 2019-10-25 ENCOUNTER — Ambulatory Visit: Payer: BC Managed Care – PPO | Admitting: Nurse Practitioner

## 2019-10-25 VITALS — BP 132/86 | HR 67 | Ht 71.0 in | Wt 218.0 lb

## 2019-10-25 DIAGNOSIS — M25562 Pain in left knee: Secondary | ICD-10-CM

## 2019-10-25 DIAGNOSIS — M2241 Chondromalacia patellae, right knee: Secondary | ICD-10-CM | POA: Diagnosis not present

## 2019-10-25 DIAGNOSIS — S8332XD Tear of articular cartilage of left knee, current, subsequent encounter: Secondary | ICD-10-CM

## 2019-10-25 DIAGNOSIS — S83282D Other tear of lateral meniscus, current injury, left knee, subsequent encounter: Secondary | ICD-10-CM

## 2019-10-25 DIAGNOSIS — G8929 Other chronic pain: Secondary | ICD-10-CM

## 2019-10-25 DIAGNOSIS — M25561 Pain in right knee: Secondary | ICD-10-CM

## 2019-10-25 HISTORY — DX: Chondromalacia patellae, right knee: M22.41

## 2019-10-25 HISTORY — DX: Other tear of lateral meniscus, current injury, left knee, subsequent encounter: S83.282D
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
## 2019-10-26 ENCOUNTER — Telehealth: Payer: Self-pay | Admitting: Orthopaedic Surgery

## 2019-10-26 NOTE — Telephone Encounter (Signed)
Left voicemail message with patient providing name and direct number for calling to set up surgery at [facility].

## 2019-11-01 ENCOUNTER — Telehealth: Payer: Self-pay | Admitting: *Deleted

## 2019-11-01 NOTE — Telephone Encounter (Signed)
Appt made for INR and visit with Dr Lajuana Ripple made for 11/08/19 - will send orders then

## 2019-11-01 NOTE — Telephone Encounter (Signed)
Patient has not been in for a coumadin check in almost 4 months.  He is way overdue.  Needs to be seen.

## 2019-11-01 NOTE — Telephone Encounter (Signed)
Dr Lajuana Ripple,  I received a call this morning from Dr Wonda Horner nurse regarding pt. He is having a procedure done on 12/08/19 :left lateral knee arthroscopy with miniscule...(general anesthesia)   He has Factor 5 and they need a coumadin consent / perioperative plan for coumadin.   They may can see order in epic, but would also like this faxed to them at [phone].

## 2019-11-07 ENCOUNTER — Other Ambulatory Visit: Payer: Self-pay

## 2019-11-07 ENCOUNTER — Other Ambulatory Visit: Payer: Self-pay | Admitting: Nurse Practitioner

## 2019-11-08 ENCOUNTER — Ambulatory Visit: Payer: BC Managed Care – PPO | Admitting: Family Medicine

## 2019-11-08 ENCOUNTER — Encounter: Payer: Self-pay | Admitting: Family Medicine

## 2019-11-08 VITALS — BP 131/80 | HR 74 | Temp 98.7°F | Ht 71.0 in | Wt 220.0 lb

## 2019-11-08 DIAGNOSIS — F1911 Other psychoactive substance abuse, in remission: Secondary | ICD-10-CM | POA: Diagnosis not present

## 2019-11-08 DIAGNOSIS — D6851 Activated protein C resistance: Secondary | ICD-10-CM | POA: Diagnosis not present

## 2019-11-08 LAB — COAGUCHEK XS/INR WAIVED
INR: 1.8 — ABNORMAL HIGH (ref 0.9–1.1)
Prothrombin Time: 21.1 s

## 2019-11-08 LAB — POCT INR: INR: 1.8 — AB (ref 2–3)

## 2019-11-08 NOTE — Progress Notes (Signed)
Subjective: CC: INR check PCP: Janora Norlander, DO SWN:IOEVOJJ Adam Landry is a 38 y.o. male presenting to clinic today for:  1. Factor 5 leiden mutation/ former drug abuse Goal INR: 2-3 Current regimen: 10 mg daily except for Thursdays when he takes 15 mg daily.  Occasionally has bleeding with hemorrhoids.  He does admit to eating greens very frequently lately.  Denies any other bleeding.    He continues to be sober and has daily checkups with his sponsor.  He also has as needed meetings.  He seems to be doing well from this standpoint and is doing a lot of self exploring/realization.   ROS: Per HPI  No Known Allergies Past Medical History:  Diagnosis Date  . Clotting disorder ([facility])   . Factor 5 Leiden mutation, heterozygous ([facility])   . Hypercholesterolemia   . Substance abuse ([facility])     Current Outpatient Medications:  .  warfarin (COUMADIN) 10 MG tablet, Take 1 tablet by mouth once daily, Disp: 90 tablet, Rfl: 0 .  warfarin (COUMADIN) 5 MG tablet, TAKE 1 TABLET BY MOUTH EVERY THURSDAY (Patient taking differently: Take on Thursday in addition to 10mg ), Disp: 30 tablet, Rfl: 0 .  baclofen (LIORESAL) 10 MG tablet, Take 1 tablet (10 mg total) by mouth 3 (three) times daily. (Patient not taking: Reported on 11/08/2019), Disp: 60 each, Rfl: 2 .  clotrimazole-betamethasone (LOTRISONE) cream, APPLY 1 APPLICATION OF CREAM TOPICALLY TWICE DAILY (Patient not taking: Reported on 11/08/2019), Disp: 45 g, Rfl: 0 Social History   Socioeconomic History  . Marital status: Single    Spouse name: Not on file  . Number of children: Not on file  . Years of education: Not on file  . Highest education level: Not on file  Occupational History  . Not on file  Social Needs  . Financial resource strain: Not on file  . Food insecurity    Worry: Not on file    Inability: Not on file  . Transportation needs    Medical: Not on file    Non-medical: Not on file  Tobacco Use  . Smoking status:  Current Every Day Smoker    Packs/day: 1.00    Years: 15.00    Pack years: 15.00  . Smokeless tobacco: Never Used  Substance and Sexual Activity  . Alcohol use: Not Currently    Comment: See SA Chart  . Drug use: Not Currently    Types: Methamphetamines, Marijuana, Amphetamines    Comment: See SA Chart  . Sexual activity: Yes  Lifestyle  . Physical activity    Days per week: Not on file    Minutes per session: Not on file  . Stress: Not on file  Relationships  . Social Herbalist on phone: Not on file    Gets together: Not on file    Attends religious service: Not on file    Active member of club or organization: Not on file    Attends meetings of clubs or organizations: Not on file    Relationship status: Not on file  . Intimate partner violence    Fear of current or ex partner: Not on file    Emotionally abused: Not on file    Physically abused: Not on file    Forced sexual activity: Not on file  Other Topics Concern  . Not on file  Social History Narrative  . Not on file   Family History  Problem Relation Age of Onset  .  Hypertension Mother   . Hyperlipidemia Mother   . Hypertension Sister   . Alcohol abuse Father     Objective: Office vital signs reviewed. BP (!) 144/95   Pulse 74   Temp 98.7 F (37.1 C) (Temporal)   Ht 5\' 11"  (1.803 m)   Wt 220 lb (99.8 kg)   BMI 30.68 kg/m   Physical Examination:  General: Awake, alert, well nourished, healthy appearing. No acute distress HEENT: Normal, Sclera white. Cardio: regular rate and rhythm, S1S2 heard, no murmurs appreciated Pulm: clear to auscultation bilaterally, no wheezes, rhonchi or rales; normal work of breathing on room air Psych: Mood stable, speech normal, affect appropriate, pleasant active Depression screen [facility] 2/9 11/08/2019 07/14/2019 07/13/2019  Decreased Interest 0 0 0  Down, Depressed, Hopeless 0 0 0  PHQ - 2 Score 0 0 0  Altered sleeping 0 0 0  Tired, decreased energy 0 0 0   Change in appetite 0 0 0  Feeling bad or failure about yourself  0 - 0  Trouble concentrating 0 0 0  Moving slowly or fidgety/restless 0 0 0  Suicidal thoughts 0 0 0  PHQ-9 Score 0 0 0  Difficult doing work/chores - - -  Some encounter information is confidential and restricted. Go to Review Flowsheets activity to see all data.  Assessment/ Plan: 38 y.o. male   1. Factor V Leiden, prothrombin gene mutation (HCC) His INR subtherapeutic at 1.8 today.  Of asked that he increase his dose to 15 mg tomorrow then resume normal dosing of 10 mg daily except for Thursdays he takes 15 mg.  He will follow-up in 2 weeks for repeat INR - CoaguChek XS/INR Waived - POCT INR  2. Substance abuse in remission [facility]) Doing well.  Continues to be sober.  He seems to have good support.  He certainly seems healthier than I have seen him in a while.    No orders of the defined types were placed in this encounter.  No orders of the defined types were placed in this encounter.    [facility], DO [facility] [phone]

## 2019-11-28 ENCOUNTER — Telehealth: Payer: Self-pay | Admitting: Orthopaedic Surgery

## 2019-11-28 NOTE — Telephone Encounter (Signed)
Patient called to cancel surgery for left knee at [facility] on 12-08-19.  He would like to schedule closer to March of 2021.

## 2019-11-28 NOTE — Telephone Encounter (Signed)
thanks

## 2019-11-28 NOTE — Telephone Encounter (Signed)
FYI

## 2019-12-12 ENCOUNTER — Encounter: Payer: Self-pay | Admitting: Family

## 2019-12-12 ENCOUNTER — Ambulatory Visit (INDEPENDENT_AMBULATORY_CARE_PROVIDER_SITE_OTHER): Payer: BC Managed Care – PPO | Admitting: Family

## 2019-12-12 ENCOUNTER — Other Ambulatory Visit: Payer: Self-pay

## 2019-12-12 DIAGNOSIS — J069 Acute upper respiratory infection, unspecified: Secondary | ICD-10-CM | POA: Diagnosis not present

## 2019-12-12 NOTE — Progress Notes (Signed)
Virtual Visit via telephone Note Due to COVID-19 pandemic this visit was conducted virtually. This visit type was conducted due to national recommendations for restrictions regarding the COVID-19 Pandemic (e.g. social distancing, sheltering in place) in an effort to limit this patient's exposure and mitigate transmission in our community. All issues noted in this document were discussed and addressed.  A physical exam was not performed with this format.  I connected with Adam Landry on 12/12/19 at 1:46 pm by telephone and verified that I am speaking with the correct person using two identifiers. Adam Landry is currently located at home and no one is currently with him during visit. The provider, Evelina Dun, FNP is located in their office at time of visit.  I discussed the limitations, risks, security and privacy concerns of performing an evaluation and management service by telephone and the availability of in person appointments. I also discussed with the patient that there may be a patient responsible charge related to this service. The patient expressed understanding and agreed to proceed.   History and Present Illness:  Cough This is a new problem. The problem has been gradually improving. The problem occurs every few minutes. The cough is productive of purulent sputum. Associated symptoms include nasal congestion, postnasal drip, rhinorrhea and a sore throat (started, but improved now). Pertinent negatives include no chills, ear congestion, ear pain, fever, headaches, myalgias, shortness of breath or wheezing. The symptoms are aggravated by lying down. Risk factors for lung disease include occupational exposure and smoking/tobacco exposure. He has tried OTC cough suppressant for the symptoms. The treatment provided moderate relief.    Pt calls the office today with cough. He states he was outside working in the cold on 12/16-12/17. He states he woke up with intermittent  coughing and sneezing. He reports today he feels much better, however, needs a note to return back to work. He states he called out 12/09/19-12/12/19. Denies any fever,   Review of Systems  Constitutional: Negative for chills and fever.  HENT: Positive for postnasal drip, rhinorrhea and sore throat (started, but improved now). Negative for ear pain.   Respiratory: Positive for cough. Negative for shortness of breath and wheezing.   Musculoskeletal: Negative for myalgias.  Neurological: Negative for headaches.  All other systems reviewed and are negative.    Observations/Objective: No SOB or distress noted  Assessment and Plan: 1. Viral URI with cough -Continue to force fluids Rest Will give note to return back to work Tylenol as needed Mucinex as needed Smoking cessation  Call if symptoms worsen or do not improve       I discussed the assessment and treatment plan with the patient. The patient was provided an opportunity to ask questions and all were answered. The patient agreed with the plan and demonstrated an understanding of the instructions.   The patient was advised to call back or seek an in-person evaluation if the symptoms worsen or if the condition fails to improve as anticipated.  The above assessment and management plan was discussed with the patient. The patient verbalized understanding of and has agreed to the management plan. Patient is aware to call the clinic if symptoms persist or worsen. Patient is aware when to return to the clinic for a follow-up visit. Patient educated on when it is appropriate to go to the emergency department.   Time call ended:  1:54 pm  I provided 8 minutes of non-face-to-face time during this encounter.    Evelina Dun, FNP

## 2019-12-13 ENCOUNTER — Inpatient Hospital Stay: Payer: BC Managed Care – PPO | Admitting: Orthopedic Surgery

## 2019-12-13 ENCOUNTER — Inpatient Hospital Stay: Payer: BC Managed Care – PPO | Admitting: Orthopaedic Surgery

## 2019-12-14 ENCOUNTER — Encounter: Payer: Self-pay | Admitting: Family Medicine

## 2019-12-14 ENCOUNTER — Ambulatory Visit (INDEPENDENT_AMBULATORY_CARE_PROVIDER_SITE_OTHER): Payer: BC Managed Care – PPO | Admitting: Family Medicine

## 2019-12-14 ENCOUNTER — Ambulatory Visit: Payer: BC Managed Care – PPO | Attending: Internal Medicine

## 2019-12-14 ENCOUNTER — Other Ambulatory Visit: Payer: Self-pay

## 2019-12-14 DIAGNOSIS — J019 Acute sinusitis, unspecified: Secondary | ICD-10-CM

## 2019-12-14 DIAGNOSIS — Z20822 Contact with and (suspected) exposure to covid-19: Secondary | ICD-10-CM

## 2019-12-14 DIAGNOSIS — Z20828 Contact with and (suspected) exposure to other viral communicable diseases: Secondary | ICD-10-CM | POA: Diagnosis not present

## 2019-12-14 MED ORDER — AMOXICILLIN-POT CLAVULANATE 875-125 MG PO TABS: 1.0000 | ORAL_TABLET | Freq: Two times a day (BID) | ORAL | 0 refills | Status: AC

## 2019-12-14 NOTE — Patient Instructions (Signed)
This information is directly available on the CDC website: RunningShows.co.za.html    Source:CDC Reference to specific commercial products, manufacturers, companies, or trademarks does not constitute its endorsement or recommendation by the [place], [place], or Centers for [employer] and Prevention.   Sinusitis, Adult Sinusitis is soreness and swelling (inflammation) of your sinuses. Sinuses are hollow spaces in the bones around your face. They are located:  Around your eyes.  In the middle of your forehead.  Behind your nose.  In your cheekbones. Your sinuses and nasal passages are lined with a fluid called mucus. Mucus drains out of your sinuses. Swelling can trap mucus in your sinuses. This lets germs (bacteria, virus, or fungus) grow, which leads to infection. Most of the time, this condition is caused by a virus. What are the causes? This condition is caused by:  Allergies.  Asthma.  Germs.  Things that block your nose or sinuses.  Growths in the nose (nasal polyps).  Chemicals or irritants in the air.  Fungus (rare). What increases the risk? You are more likely to develop this condition if:  You have a weak body defense system (immune system).  You do a lot of swimming or diving.  You use nasal sprays too much.  You smoke. What are the signs or symptoms? The main symptoms of this condition are pain and a feeling of pressure around the sinuses. Other symptoms include:  Stuffy nose (congestion).  Runny nose (drainage).  Swelling and warmth in the sinuses.  Headache.  Toothache.  A cough that may get worse at night.  Mucus that collects in the throat or the back of the nose (postnasal drip).  Being unable to smell and taste.  Being very tired (fatigue).  A fever.  Sore throat.  Bad breath. How is this diagnosed? This condition is diagnosed  based on:  Your symptoms.  Your medical history.  A physical exam.  Tests to find out if your condition is short-term (acute) or long-term (chronic). Your doctor may: ? Check your nose for growths (polyps). ? Check your sinuses using a tool that has a light (endoscope). ? Check for allergies or germs. ? Do imaging tests, such as an MRI or CT scan. How is this treated? Treatment for this condition depends on the cause and whether it is short-term or long-term.  If caused by a virus, your symptoms should go away on their own within 10 days. You may be given medicines to relieve symptoms. They include: ? Medicines that shrink swollen tissue in the nose. ? Medicines that treat allergies (antihistamines). ? A spray that treats swelling of the nostrils. ? Rinses that help get rid of thick mucus in your nose (nasal saline washes).  If caused by bacteria, your doctor may wait to see if you will get better without treatment. You may be given antibiotic medicine if you have: ? A very bad infection. ? A weak body defense system.  If caused by growths in the nose, you may need to have surgery. Follow these instructions at home: Medicines  Take, use, or apply over-the-counter and prescription medicines only as told by your doctor. These may include nasal sprays.  If you were prescribed an antibiotic medicine, take it as told by your doctor. Do not stop taking the antibiotic even if you start to feel better. Hydrate and humidify   Drink enough water to keep your pee (urine) pale yellow.  Use a cool mist humidifier to keep  the humidity level in your home above 50%.  Breathe in steam for 10-15 minutes, 3-4 times a day, or as told by your doctor. You can do this in the bathroom while a hot shower is running.  Try not to spend time in cool or dry air. Rest  Rest as much as you can.  Sleep with your head raised (elevated).  Make sure you get enough sleep each night. General  instructions   Put a warm, moist washcloth on your face 3-4 times a day, or as often as told by your doctor. This will help with discomfort.  Wash your hands often with soap and water. If there is no soap and water, use hand sanitizer.  Do not smoke. Avoid being around people who are smoking (secondhand smoke).  Keep all follow-up visits as told by your doctor. This is important. Contact a doctor if:  You have a fever.  Your symptoms get worse.  Your symptoms do not get better within 10 days. Get help right away if:  You have a very bad headache.  You cannot stop throwing up (vomiting).  You have very bad pain or swelling around your face or eyes.  You have trouble seeing.  You feel confused.  Your neck is stiff.  You have trouble breathing. Summary  Sinusitis is swelling of your sinuses. Sinuses are hollow spaces in the bones around your face.  This condition is caused by tissues in your nose that become inflamed or swollen. This traps germs. These can lead to infection.  If you were prescribed an antibiotic medicine, take it as told by your doctor. Do not stop taking it even if you start to feel better.  Keep all follow-up visits as told by your doctor. This is important. This information is not intended to replace advice given to you by your health care provider. Make sure you discuss any questions you have with your health care provider. Document Released: 05/26/2008 Document Revised: 05/10/2018 Document Reviewed: 05/10/2018 Elsevier Patient Education  2020 [employer].

## 2019-12-14 NOTE — Progress Notes (Signed)
Virtual Visit via Telephone Note  I connected with Adam Landry on 12/14/19 at 2:40 PM by telephone and verified that I am speaking with the correct person using two identifiers. Adam Landry is currently located at work and nobody is currently with him during this visit. The provider, Gwenlyn Fudge, FNP is located in their office at time of visit.  I discussed the limitations, risks, security and privacy concerns of performing an evaluation and management service by telephone and the availability of in person appointments. I also discussed with the patient that there may be a patient responsible charge related to this service. The patient expressed understanding and agreed to proceed.  Subjective: PCP: Adam Ip, DO  Chief Complaint  Patient presents with  . Sore Throat   Patient complains of cough, head congestion, headache, sneezing, sore throat and facial pain/pressure. Onset of symptoms was 1 week ago, gradually worsening since that time. He is drinking plenty of fluids. Evaluation to date: none. Treatment to date: Alka selzter cold. He does not have a history of asthma, allergies, or COPD. He does smoke.    ROS: Per HPI  Current Outpatient Medications:  .  baclofen (LIORESAL) 10 MG tablet, Take 1 tablet (10 mg total) by mouth 3 (three) times daily. (Patient not taking: Reported on 11/08/2019), Disp: 60 each, Rfl: 2 .  clotrimazole-betamethasone (LOTRISONE) cream, APPLY 1 APPLICATION OF CREAM TOPICALLY TWICE DAILY (Patient not taking: Reported on 11/08/2019), Disp: 45 g, Rfl: 0 .  warfarin (COUMADIN) 10 MG tablet, Take 1 tablet by mouth once daily, Disp: 90 tablet, Rfl: 0 .  warfarin (COUMADIN) 5 MG tablet, TAKE 1 TABLET BY MOUTH EVERY THURSDAY (Patient taking differently: Take on Thursday in addition to 10mg ), Disp: 30 tablet, Rfl: 0  No Known Allergies Past Medical History:  Diagnosis Date  . Clotting disorder (HCC)   . Factor 5 Leiden mutation,  heterozygous (HCC)   . Hypercholesterolemia   . Substance abuse (HCC)     Observations/Objective: A&O  No respiratory distress or wheezing audible over the phone Mood, judgement, and thought processes all WNL  Assessment and Plan: 1. Acute non-recurrent sinusitis, unspecified location - Education provided on COVID-19 and sinusitis.  Encourage patient to get tested for COVID-19 and quarantine until he gets his results back.  Discussed symptom management.  Advised that he may go ahead and start on antibiotics but that if his Covid test results as positive he should stop them.  Work note provided. - amoxicillin-clavulanate (AUGMENTIN) 875-125 MG tablet; Take 1 tablet by mouth 2 (two) times daily for 7 days.  Dispense: 14 tablet; Refill: 0   Follow Up Instructions:  I discussed the assessment and treatment plan with the patient. The patient was provided an opportunity to ask questions and all were answered. The patient agreed with the plan and demonstrated an understanding of the instructions.   The patient was advised to call back or seek an in-person evaluation if the symptoms worsen or if the condition fails to improve as anticipated.  The above assessment and management plan was discussed with the patient. The patient verbalized understanding of and has agreed to the management plan. Patient is aware to call the clinic if symptoms persist or worsen. Patient is aware when to return to the clinic for a follow-up visit. Patient educated on when it is appropriate to go to the emergency department.   Time call ended: 2:55 PM  I provided 18 minutes of non-face-to-face time during this  encounter.  Hendricks Limes, MSN, APRN, FNP-C [facility] 12/14/19

## 2019-12-16 LAB — NOVEL CORONAVIRUS, NAA: SARS-CoV-2, NAA: NOT DETECTED

## 2020-01-11 ENCOUNTER — Other Ambulatory Visit: Payer: Self-pay

## 2020-01-11 ENCOUNTER — Ambulatory Visit: Payer: BC Managed Care – PPO | Admitting: Family Medicine

## 2020-01-11 ENCOUNTER — Encounter: Payer: Self-pay | Admitting: Family Medicine

## 2020-01-11 VITALS — BP 121/80 | HR 65 | Temp 98.4°F | Resp 20 | Ht 71.0 in | Wt 226.0 lb

## 2020-01-11 DIAGNOSIS — M7711 Lateral epicondylitis, right elbow: Secondary | ICD-10-CM | POA: Diagnosis not present

## 2020-01-11 DIAGNOSIS — D6851 Activated protein C resistance: Secondary | ICD-10-CM

## 2020-01-11 DIAGNOSIS — Z7901 Long term (current) use of anticoagulants: Secondary | ICD-10-CM | POA: Diagnosis not present

## 2020-01-11 LAB — POCT INR: INR: 2.2 (ref 2.0–3.0)

## 2020-01-11 MED ORDER — WARFARIN SODIUM 10 MG PO TABS: 10.0000 mg | ORAL_TABLET | Freq: Every day | ORAL | 0 refills | Status: DC

## 2020-01-11 MED ORDER — WARFARIN SODIUM 5 MG PO TABS: ORAL_TABLET | ORAL | 0 refills | Status: DC

## 2020-01-11 MED ORDER — PREDNISONE 20 MG PO TABS: ORAL_TABLET | ORAL | 0 refills | Status: DC

## 2020-01-11 NOTE — Patient Instructions (Signed)
Tennis Elbow Tennis elbow is swelling (inflammation) in your outer forearm, near your elbow. Swelling affects the tissues that connect muscle to bone (tendons). Tennis elbow can happen in any sport or job in which you use your elbow too much. It is caused by doing the same motion over and over. Tennis elbow can cause:  Pain and tenderness in your forearm and the outer part of your elbow. You may have pain all the time, or only when using the arm.  A burning feeling. This runs from your elbow through your arm.  Weak grip in your hand. Follow these instructions at home: Activity  Rest your elbow and wrist. Avoid activities that cause problems, as told by your doctor.  If told by your doctor, wear an elbow strap to reduce stress on the area.  Do physical therapy exercises as told.  If you lift an object, lift it with your palm facing up. This is easier on your elbow. Lifestyle  If your tennis elbow is caused by sports, check your equipment and make sure that: ? You are using it correctly. ? It fits you well.  If your tennis elbow is caused by work or by using a computer, take breaks often to stretch your arm. Talk with your manager about how you can manage your condition at work. If you have a brace:  Wear the brace as told by your doctor. Remove it only as told by your doctor.  Loosen the brace if your fingers tingle, get numb, or turn cold and blue.  Keep the brace clean.  If the brace is not waterproof, ask your doctor if you may take the brace off for bathing. If you must keep the brace on while bathing: ? Do not let it get wet. ? Cover it with a watertight covering when you take a bath or a shower. General instructions   If told, put ice on the painful area: ? Put ice in a plastic bag. ? Place a towel between your skin and the bag. ? Leave the ice on for 20 minutes, 2-3 times a day.  Take over-the-counter and prescription medicines only as told by your doctor.  Keep  all follow-up visits as told by your doctor. This is important. Contact a doctor if:  Your pain does not get better with treatment.  Your pain gets worse.  You have weakness in your forearm, hand, or fingers.  You cannot feel your forearm, hand, or fingers. Summary  Tennis elbow is swelling (inflammation) in your outer forearm, near your elbow.  Tennis elbow is caused by doing the same motion over and over.  Rest your elbow and wrist. Avoid activities that cause problems, as told by your doctor.  If told, put ice on the painful area for 20 minutes, 2-3 times a day. This information is not intended to replace advice given to you by your health care provider. Make sure you discuss any questions you have with your health care provider. Document Revised: 09/03/2018 Document Reviewed: 09/22/2017 Elsevier Patient Education  2020 Elsevier Inc.  

## 2020-01-11 NOTE — Progress Notes (Signed)
Subjective:  Patient ID: Adam Landry, male    DOB: 1981-11-04, 39 y.o.   MRN: [id number]  Patient Care Team: Janora Norlander, DO as PCP - General (Family Medicine) Magrinat, Virgie Dad, MD as Consulting Physician (Oncology)   Chief Complaint:  right elbow pain (2-3 weeks )   HPI: Adam Landry is a 39 y.o. male presenting on 01/11/2020 for right elbow pain (2-3 weeks )   Patient presents today for evaluation of right elbow pain.  Patient states his pain started 2 to 3 weeks ago.  He has been evaluated by his chiropractor who suggested a counterforce strap.  Patient has been using this without relief of symptoms.  He states the pain is located on his lateral elbow and is worse with certain motions.  States the pain is aching to sharp and 6-8 out of 10 at worst.  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  He denies any injury, swelling, erythema, or open wounds.  No fever, chills, or other associated symptoms.  He has tried Tylenol and a counterforce strap without relief of symptoms.  States he would also like a refill on his Coumadin today.  He has factor V Leiden and has been on Coumadin for several years.  He denies any shortness of breath, cough, chest pain, wheezing, stridor, dizziness, syncope, or leg swelling.  No abnormal bleeding or bruising.  He has been taking Coumadin as prescribed.  Has not had his INR checked in a while.    Relevant past medical, surgical, family, and social history reviewed and updated as indicated.  Allergies and medications reviewed and updated. Date reviewed: Chart in Epic.   Past Medical History:  Diagnosis Date  . Clotting disorder ([place])   . Factor 5 Leiden mutation, heterozygous ([place])   . Hypercholesterolemia   . Substance abuse [facility])     Past Surgical History:  Procedure Laterality Date  . MULTIPLE TOOTH EXTRACTIONS  08/26/18    Social History   Socioeconomic History  . Marital  status: Single    Spouse name: Not on file  . Number of children: Not on file  . Years of education: Not on file  . Highest education level: Not on file  Occupational History  . Not on file  Tobacco Use  . Smoking status: Current Every Day Smoker    Packs/day: 1.00    Years: 15.00    Pack years: 15.00  . Smokeless tobacco: Never Used  Substance and Sexual Activity  . Alcohol use: Not Currently    Comment: See SA Chart  . Drug use: Not Currently    Types: Methamphetamines, Marijuana, Amphetamines    Comment: See SA Chart  . Sexual activity: Yes  Other Topics Concern  . Not on file  Social History Narrative  . Not on file   Social Determinants of Health   Financial Resource Strain:   . Difficulty of Paying Living Expenses: Not on file  Food Insecurity:   . Worried About Charity fundraiser in the Last Year: Not on file  . Ran Out of Food in the Last Year: Not on file  Transportation Needs:   . Lack of Transportation (Medical): Not on file  . Lack of Transportation (Non-Medical): Not on file  Physical Activity:   . Days of Exercise per Week: Not on file  . Minutes of Exercise per Session: Not on file  Stress:   . Feeling of Stress :  Not on file  Social Connections:   . Frequency of Communication with Friends and Family: Not on file  . Frequency of Social Gatherings with Friends and Family: Not on file  . Attends Religious Services: Not on file  . Active Member of Clubs or Organizations: Not on file  . Attends Banker Meetings: Not on file  . Marital Status: Not on file  Intimate Partner Violence:   . Fear of Current or Ex-Partner: Not on file  . Emotionally Abused: Not on file  . Physically Abused: Not on file  . Sexually Abused: Not on file    Outpatient Encounter Medications as of 01/11/2020  Medication Sig  . warfarin (COUMADIN) 10 MG tablet Take 1 tablet (10 mg total) by mouth daily.  Marland Kitchen warfarin (COUMADIN) 5 MG tablet TAKE 1 TABLET BY MOUTH EVERY  THURSDAY  . [DISCONTINUED] warfarin (COUMADIN) 10 MG tablet Take 1 tablet by mouth once daily  . [DISCONTINUED] warfarin (COUMADIN) 5 MG tablet TAKE 1 TABLET BY MOUTH EVERY THURSDAY (Patient taking differently: Take on Thursday in addition to 10mg )  . predniSONE (DELTASONE) 20 MG tablet 2 po at sametime daily for 5 days  . [DISCONTINUED] baclofen (LIORESAL) 10 MG tablet Take 1 tablet (10 mg total) by mouth 3 (three) times daily. (Patient not taking: Reported on 11/08/2019)  . [DISCONTINUED] clotrimazole-betamethasone (LOTRISONE) cream APPLY 1 APPLICATION OF CREAM TOPICALLY TWICE DAILY (Patient not taking: Reported on 11/08/2019)   No facility-administered encounter medications on file as of 01/11/2020.    No Known Allergies  Review of Systems  Constitutional: Negative for activity change, appetite change, chills, diaphoresis, fatigue, fever and unexpected weight change.  HENT: Negative.   Eyes: Negative.   Respiratory: Negative for cough, chest tightness, shortness of breath, wheezing and stridor.   Cardiovascular: Negative for chest pain, palpitations and leg swelling.  Gastrointestinal: Negative for abdominal pain, anal bleeding, blood in stool, constipation, diarrhea, nausea and vomiting.  Endocrine: Negative.   Genitourinary: Negative for decreased urine volume, difficulty urinating, dysuria, frequency, hematuria and urgency.  Musculoskeletal: Positive for arthralgias and myalgias.  Skin: Negative.   Allergic/Immunologic: Negative.   Neurological: Negative for dizziness, tremors, seizures, syncope, facial asymmetry, speech difficulty, weakness, light-headedness, numbness and headaches.  Hematological: Negative.  Does not bruise/bleed easily.  Psychiatric/Behavioral: Negative for confusion, hallucinations, sleep disturbance and suicidal ideas.  All other systems reviewed and are negative.       Objective:  BP 121/80   Pulse 65   Temp 98.4 F (36.9 C)   Resp 20   Ht 5\' 11"   (1.803 m)   Wt 226 lb (102.5 kg)   SpO2 97%   BMI 31.52 kg/m    Wt Readings from Last 3 Encounters:  01/11/20 226 lb (102.5 kg)  11/08/19 220 lb (99.8 kg)  10/25/19 218 lb (98.9 kg)    Physical Exam Vitals and nursing note reviewed.  Constitutional:      General: He is not in acute distress.    Appearance: Normal appearance. He is well-developed and well-groomed. He is not ill-appearing, toxic-appearing or diaphoretic.  HENT:     Head: Normocephalic and atraumatic.     Jaw: There is normal jaw occlusion.     Right Ear: Hearing normal.     Left Ear: Hearing normal.     Nose: Nose normal.     Mouth/Throat:     Lips: Pink.     Mouth: Mucous membranes are moist.     Pharynx: Oropharynx is clear. Uvula  midline.  Eyes:     General: Lids are normal.     Extraocular Movements: Extraocular movements intact.     Conjunctiva/sclera: Conjunctivae normal.     Pupils: Pupils are equal, round, and reactive to light.  Neck:     Thyroid: No thyroid mass, thyromegaly or thyroid tenderness.     Vascular: No carotid bruit or JVD.     Trachea: Trachea and phonation normal.  Cardiovascular:     Rate and Rhythm: Normal rate and regular rhythm.     Chest Wall: PMI is not displaced.     Pulses: Normal pulses.     Heart sounds: Normal heart sounds. No murmur. No friction rub. No gallop.   Pulmonary:     Effort: Pulmonary effort is normal. No respiratory distress.     Breath sounds: Normal breath sounds. No wheezing.  Abdominal:     General: Bowel sounds are normal. There is no distension or abdominal bruit.     Palpations: Abdomen is soft. There is no hepatomegaly or splenomegaly.     Tenderness: There is no abdominal tenderness. There is no right CVA tenderness or left CVA tenderness.     Hernia: No hernia is present.  Musculoskeletal:     Right shoulder: Normal.     Left shoulder: Normal.     Right upper arm: Normal.     Left upper arm: Normal.     Right elbow: No swelling, deformity,  effusion or lacerations. Normal range of motion. Tenderness present in lateral epicondyle.     Left elbow: No swelling, deformity, effusion or lacerations. Normal range of motion. No tenderness. No radial head, medial epicondyle, lateral epicondyle or olecranon process tenderness.     Right wrist: Normal.     Left wrist: Normal.       Arms:     Cervical back: Normal range of motion and neck supple.     Right lower leg: No edema.     Left lower leg: No edema.     Comments: Right elbow: Resisted wrist extension passive wrist flexion.  Lymphadenopathy:     Cervical: No cervical adenopathy.  Skin:    General: Skin is warm and dry.     Capillary Refill: Capillary refill takes less than 2 seconds.     Coloration: Skin is not cyanotic, jaundiced or pale.     Findings: No bruising, erythema or rash.  Neurological:     General: No focal deficit present.     Mental Status: He is alert and oriented to person, place, and time.     Cranial Nerves: Cranial nerves are intact. No cranial nerve deficit.     Sensory: Sensation is intact. No sensory deficit.     Motor: Motor function is intact. No weakness.     Coordination: Coordination is intact. Coordination normal.     Gait: Gait is intact. Gait normal.     Deep Tendon Reflexes: Reflexes are normal and symmetric. Reflexes normal.  Psychiatric:        Attention and Perception: Attention and perception normal.        Mood and Affect: Mood and affect normal.        Speech: Speech normal.        Behavior: Behavior normal. Behavior is cooperative.        Thought Content: Thought content normal.        Cognition and Memory: Cognition and memory normal.        Judgment: Judgment normal.  Results for orders placed or performed in visit on 01/11/20  CoaguChek XS/INR Waived  Result Value Ref Range   INR 2.2 (H) 0.9 - 1.1   Prothrombin Time 26.1 sec  POCT INR  Result Value Ref Range   INR 2.2 2.0 - 3.0       Pertinent labs & imaging results  that were available during my care of the patient were reviewed by me and considered in my medical decision making.  Assessment & Plan:  Trevaris was seen today for right elbow pain.  Diagnoses and all orders for this visit:  Lateral epicondylitis of right elbow Right tennis elbow due to repetitive wrist motions with occupation.  No deformities, crepitus, or swelling noted.  Tenderness to lateral epicondyle.  Pain with wrist flexion and extension.  Unable to dose with NSAIDs due to Coumadin therapy.  Will give burst prednisone.  Trigger point injection not performed today due to anticoagulation therapy.  Symptomatic care discussed in detail.  Patient aware to report any new, worsening, or persistent symptoms.  Continue use of counterforce strap. -     predniSONE (DELTASONE) 20 MG tablet; 2 po at sametime daily for 5 days  Factor V Leiden, prothrombin gene mutation (HCC) Long term current use of anticoagulant therapy INR 2.2 today.  We will continue current regimen.  Patient aware to follow-up in 4 weeks for repeat INR. -     warfarin (COUMADIN) 10 MG tablet; Take 1 tablet (10 mg total) by mouth daily. -     warfarin (COUMADIN) 5 MG tablet; TAKE 1 TABLET BY MOUTH EVERY THURSDAY -     CoaguChek XS/INR Waived -     POCT INR     Continue all other maintenance medications.  Follow up plan: Return in about 6 weeks (around 02/22/2020), or if symptoms worsen or fail to improve.  Continue healthy lifestyle choices, including diet (rich in fruits, vegetables, and lean proteins, and low in salt and simple carbohydrates) and exercise (at least 30 minutes of moderate physical activity daily).  Educational handout given for tennis elbow   The above assessment and management plan was discussed with the patient. The patient verbalized understanding of and has agreed to the management plan. Patient is aware to call the clinic if they develop any new symptoms or if symptoms persist or worsen. Patient is  aware when to return to the clinic for a follow-up visit. Patient educated on when it is appropriate to go to the emergency department.   Kari Baars, FNP-C [facility] [phone]

## 2020-01-12 LAB — COAGUCHEK XS/INR WAIVED
INR: 2.2 — ABNORMAL HIGH (ref 0.9–1.1)
Prothrombin Time: 26.1 s

## 2020-02-24 ENCOUNTER — Encounter: Payer: Self-pay | Admitting: Family Medicine

## 2020-02-24 ENCOUNTER — Ambulatory Visit (INDEPENDENT_AMBULATORY_CARE_PROVIDER_SITE_OTHER): Payer: BC Managed Care – PPO | Admitting: Family Medicine

## 2020-02-24 ENCOUNTER — Ambulatory Visit (INDEPENDENT_AMBULATORY_CARE_PROVIDER_SITE_OTHER): Payer: BC Managed Care – PPO

## 2020-02-24 ENCOUNTER — Other Ambulatory Visit: Payer: Self-pay

## 2020-02-24 ENCOUNTER — Encounter: Payer: Self-pay | Admitting: *Deleted

## 2020-02-24 VITALS — BP 111/70 | HR 69 | Temp 99.1°F | Resp 20 | Ht 71.0 in | Wt 218.0 lb

## 2020-02-24 DIAGNOSIS — M25521 Pain in right elbow: Secondary | ICD-10-CM

## 2020-02-24 DIAGNOSIS — M7711 Lateral epicondylitis, right elbow: Secondary | ICD-10-CM

## 2020-02-24 MED ORDER — METHYLPREDNISOLONE ACETATE 40 MG/ML IJ SUSP
40.0000 mg | Freq: Once | INTRAMUSCULAR | Status: AC
  Administered 2020-02-24: 40 mg via INTRA_ARTICULAR

## 2020-02-24 NOTE — Patient Instructions (Signed)
Tennis Elbow Tennis elbow is swelling (inflammation) in your outer forearm, near your elbow. Swelling affects the tissues that connect muscle to bone (tendons). Tennis elbow can happen in any sport or job in which you use your elbow too much. It is caused by doing the same motion over and over. Tennis elbow can cause:  Pain and tenderness in your forearm and the outer part of your elbow. You may have pain all the time, or only when using the arm.  A burning feeling. This runs from your elbow through your arm.  Weak grip in your hand. Follow these instructions at home: Activity  Rest your elbow and wrist. Avoid activities that cause problems, as told by your doctor.  If told by your doctor, wear an elbow strap to reduce stress on the area.  Do physical therapy exercises as told.  If you lift an object, lift it with your palm facing up. This is easier on your elbow. Lifestyle  If your tennis elbow is caused by sports, check your equipment and make sure that: ? You are using it correctly. ? It fits you well.  If your tennis elbow is caused by work or by using a computer, take breaks often to stretch your arm. Talk with your manager about how you can manage your condition at work. If you have a brace:  Wear the brace as told by your doctor. Remove it only as told by your doctor.  Loosen the brace if your fingers tingle, get numb, or turn cold and blue.  Keep the brace clean.  If the brace is not waterproof, ask your doctor if you may take the brace off for bathing. If you must keep the brace on while bathing: ? Do not let it get wet. ? Cover it with a watertight covering when you take a bath or a shower. General instructions   If told, put ice on the painful area: ? Put ice in a plastic bag. ? Place a towel between your skin and the bag. ? Leave the ice on for 20 minutes, 2-3 times a day.  Take over-the-counter and prescription medicines only as told by your doctor.  Keep  all follow-up visits as told by your doctor. This is important. Contact a doctor if:  Your pain does not get better with treatment.  Your pain gets worse.  You have weakness in your forearm, hand, or fingers.  You cannot feel your forearm, hand, or fingers. Summary  Tennis elbow is swelling (inflammation) in your outer forearm, near your elbow.  Tennis elbow is caused by doing the same motion over and over.  Rest your elbow and wrist. Avoid activities that cause problems, as told by your doctor.  If told, put ice on the painful area for 20 minutes, 2-3 times a day. This information is not intended to replace advice given to you by your health care provider. Make sure you discuss any questions you have with your health care provider. Document Revised: 09/03/2018 Document Reviewed: 09/22/2017 Elsevier Patient Education  2020 Elsevier Inc.  

## 2020-02-24 NOTE — Progress Notes (Signed)
Subjective:  Patient ID: Adam Landry, male    DOB: 02-Jul-1981, 39 y.o.   MRN: [id number]  Patient Care Team: [name], DO as PCP - General (Family Medicine) Magrinat, Valentino Hue, MD as Consulting Physician (Oncology)   Chief Complaint:  on-going right elbow pain (2 nd OV )   HPI: Adam Landry is a 39 y.o. male presenting on 02/24/2020 for on-going right elbow pain (2 nd OV )   Pt presents today with continued right elbow pain that is worse with supination and pronation. States the prednisone did not help the pain. He has been wearing the counter-force strap without relief of symptoms. He continues to work and this makes the pain worse. He has been doing strengthening and stretching exercises with minimal relief of the symptoms. No new injuries. States the pain is aching to throbbing and worse with movement and lifting. 6/10 at worst.    Relevant past medical, surgical, family, and social history reviewed and updated as indicated.  Allergies and medications reviewed and updated. Date reviewed: Chart in Epic.   Past Medical History:  Diagnosis Date  . Clotting disorder (HCC)   . Factor 5 Leiden mutation, heterozygous (HCC)   . Hypercholesterolemia   . Substance abuse [facility])     Past Surgical History:  Procedure Laterality Date  . MULTIPLE TOOTH EXTRACTIONS  08/26/18    Social History   Socioeconomic History  . Marital status: Single    Spouse name: Not on file  . Number of children: Not on file  . Years of education: Not on file  . Highest education level: Not on file  Occupational History  . Not on file  Tobacco Use  . Smoking status: Current Every Day Smoker    Packs/day: 1.00    Years: 15.00    Pack years: 15.00  . Smokeless tobacco: Never Used  Substance and Sexual Activity  . Alcohol use: Not Currently    Comment: See SA Chart  . Drug use: Not Currently    Types: Methamphetamines, Marijuana, Amphetamines    Comment: See SA Chart  .  Sexual activity: Yes  Other Topics Concern  . Not on file  Social History Narrative  . Not on file   Social Determinants of Health   Financial Resource Strain:   . Difficulty of Paying Living Expenses: Not on file  Food Insecurity:   . Worried About Programme researcher, broadcasting/film/video in the Last Year: Not on file  . Ran Out of Food in the Last Year: Not on file  Transportation Needs:   . Lack of Transportation (Medical): Not on file  . Lack of Transportation (Non-Medical): Not on file  Physical Activity:   . Days of Exercise per Week: Not on file  . Minutes of Exercise per Session: Not on file  Stress:   . Feeling of Stress : Not on file  Social Connections:   . Frequency of Communication with Friends and Family: Not on file  . Frequency of Social Gatherings with Friends and Family: Not on file  . Attends Religious Services: Not on file  . Active Member of Clubs or Organizations: Not on file  . Attends Banker Meetings: Not on file  . Marital Status: Not on file  Intimate Partner Violence:   . Fear of Current or Ex-Partner: Not on file  . Emotionally Abused: Not on file  . Physically Abused: Not on file  . Sexually Abused: Not on file  Outpatient Encounter Medications as of 02/24/2020  Medication Sig  . warfarin (COUMADIN) 10 MG tablet Take 1 tablet (10 mg total) by mouth daily.  Marland Kitchen warfarin (COUMADIN) 5 MG tablet TAKE 1 TABLET BY MOUTH EVERY THURSDAY  . [DISCONTINUED] predniSONE (DELTASONE) 20 MG tablet 2 po at sametime daily for 5 days  . [EXPIRED] methylPREDNISolone acetate (DEPO-MEDROL) injection 40 mg    No facility-administered encounter medications on file as of 02/24/2020.    No Known Allergies  Review of Systems  Constitutional: Negative for activity change, appetite change, chills, diaphoresis, fatigue, fever and unexpected weight change.  HENT: Negative.   Eyes: Negative.  Negative for photophobia and visual disturbance.  Respiratory: Negative for cough, chest  tightness and shortness of breath.   Cardiovascular: Negative for chest pain, palpitations and leg swelling.  Gastrointestinal: Positive for abdominal pain. Negative for blood in stool, constipation, diarrhea, nausea and vomiting.  Endocrine: Negative.   Genitourinary: Negative for decreased urine volume, difficulty urinating, dysuria, frequency and urgency.  Musculoskeletal: Positive for arthralgias and myalgias. Negative for back pain, gait problem, joint swelling, neck pain and neck stiffness.  Skin: Negative.   Allergic/Immunologic: Negative.   Neurological: Negative for dizziness, tremors, seizures, syncope, facial asymmetry, speech difficulty, weakness, light-headedness, numbness and headaches.  Hematological: Negative.  Does not bruise/bleed easily.  Psychiatric/Behavioral: Negative for confusion, hallucinations, sleep disturbance and suicidal ideas.  All other systems reviewed and are negative.       Objective:  BP 111/70   Pulse 69   Temp 99.1 F (37.3 C)   Resp 20   Ht 5\' 11"  (1.803 m)   Wt 218 lb (98.9 kg)   SpO2 96%   BMI 30.40 kg/m    Wt Readings from Last 3 Encounters:  02/24/20 218 lb (98.9 kg)  01/11/20 226 lb (102.5 kg)  11/08/19 220 lb (99.8 kg)    Physical Exam Vitals and nursing note reviewed.  Constitutional:      General: He is not in acute distress.    Appearance: Normal appearance. He is well-developed and well-groomed. He is obese. He is not ill-appearing, toxic-appearing or diaphoretic.  HENT:     Head: Normocephalic and atraumatic.     Jaw: There is normal jaw occlusion.     Right Ear: Hearing normal.     Left Ear: Hearing normal.     Nose: Nose normal.     Mouth/Throat:     Lips: Pink.     Mouth: Mucous membranes are moist.     Pharynx: Oropharynx is clear. Uvula midline.  Eyes:     General: Lids are normal.     Extraocular Movements: Extraocular movements intact.     Conjunctiva/sclera: Conjunctivae normal.     Pupils: Pupils are  equal, round, and reactive to light.  Neck:     Thyroid: No thyroid mass, thyromegaly or thyroid tenderness.     Vascular: No carotid bruit or JVD.     Trachea: Trachea and phonation normal.  Cardiovascular:     Rate and Rhythm: Normal rate and regular rhythm.     Chest Wall: PMI is not displaced.     Pulses: Normal pulses.     Heart sounds: Normal heart sounds. No murmur. No friction rub. No gallop.   Pulmonary:     Effort: Pulmonary effort is normal. No respiratory distress.     Breath sounds: Normal breath sounds. No wheezing.  Abdominal:     General: Bowel sounds are normal. There is no distension or abdominal  bruit.     Palpations: Abdomen is soft. There is no hepatomegaly or splenomegaly.     Tenderness: There is no abdominal tenderness. There is no right CVA tenderness or left CVA tenderness.     Hernia: No hernia is present.  Musculoskeletal:        General: Normal range of motion.     Right upper arm: Normal.     Right elbow: No swelling, deformity, effusion or lacerations. Normal range of motion. Tenderness present in lateral epicondyle. No radial head, medial epicondyle or olecranon process tenderness.     Left elbow: Normal.     Right forearm: Normal.       Arms:     Cervical back: Normal range of motion and neck supple.     Right lower leg: No edema.     Left lower leg: No edema.  Lymphadenopathy:     Cervical: No cervical adenopathy.  Skin:    General: Skin is warm and dry.     Capillary Refill: Capillary refill takes less than 2 seconds.     Coloration: Skin is not cyanotic, jaundiced or pale.     Findings: No rash.  Neurological:     General: No focal deficit present.     Mental Status: He is alert and oriented to person, place, and time.     Cranial Nerves: Cranial nerves are intact.     Sensory: Sensation is intact.     Motor: Motor function is intact.     Coordination: Coordination is intact.     Gait: Gait is intact.     Deep Tendon Reflexes: Reflexes  are normal and symmetric.  Psychiatric:        Attention and Perception: Attention and perception normal.        Mood and Affect: Mood and affect normal.        Speech: Speech normal.        Behavior: Behavior normal. Behavior is cooperative.        Thought Content: Thought content normal.        Cognition and Memory: Cognition and memory normal.        Judgment: Judgment normal.     Results for orders placed or performed in visit on 01/11/20  CoaguChek XS/INR Waived  Result Value Ref Range   INR 2.2 (H) 0.9 - 1.1   Prothrombin Time 26.1 sec  POCT INR  Result Value Ref Range   INR 2.2 2.0 - 3.0     X-Ray: right elbow: No acute findings. Preliminary x-ray reading by Kari Baars, FNP-C, [facility].  Pertinent labs & imaging results that were available during my care of the patient were reviewed by me and considered in my medical decision making.  Assessment & Plan:  Rawn was seen today for on-going right elbow pain.  Diagnoses and all orders for this visit:  Right elbow pain Lateral epicondylitis of right elbow Trigger point injection today due to ongoing symptoms. Symptomatic care discussed in detail. Report any new, worsening, or persistent symptoms. Follow up in 4-6 weeks or sooner if needed.  -     DG Elbow 2 Views Right; Future -     methylPREDNISolone acetate (DEPO-MEDROL) injection 40 mg -     Joint Injection/Arthrocentesis    Joint Injection/Arthrocentesis  Date/Time: 02/24/2020 12:08 PM Performed by: Sonny Masters, FNP Authorized by: Sonny Masters, FNP  Indications: pain  Body area: elbow Joint: right elbow Local anesthesia used: yes  Anesthesia: Local anesthesia used:  yes Local Anesthetic: lidocaine spray  Sedation: Patient sedated: no  Preparation: Patient was prepped and draped in the usual sterile fashion. Needle size: 22 G Ultrasound guidance: no Approach: lateral Aspirate amount: 0 mL Methylprednisolone amount: 40 mg Lidocaine 2% amount: 0.5  mL Patient tolerance: patient tolerated the procedure well with no immediate complications     Continue all other maintenance medications.  Follow up plan: Return in about 6 weeks (around 04/06/2020), or if symptoms worsen or fail to improve, for elbow pain.  Continue healthy lifestyle choices, including diet (rich in fruits, vegetables, and lean proteins, and low in salt and simple carbohydrates) and exercise (at least 30 minutes of moderate physical activity daily).  Educational handout given for tennis elbow  The above assessment and management plan was discussed with the patient. The patient verbalized understanding of and has agreed to the management plan. Patient is aware to call the clinic if they develop any new symptoms or if symptoms persist or worsen. Patient is aware when to return to the clinic for a follow-up visit. Patient educated on when it is appropriate to go to the emergency department.   Monia Pouch, FNP-C [facility] [phone]

## 2020-03-10 ENCOUNTER — Other Ambulatory Visit: Payer: Self-pay | Admitting: Family Medicine

## 2020-03-19 ENCOUNTER — Other Ambulatory Visit: Payer: Self-pay

## 2020-03-19 ENCOUNTER — Encounter: Payer: Self-pay | Admitting: Nurse Practitioner

## 2020-03-19 ENCOUNTER — Ambulatory Visit (INDEPENDENT_AMBULATORY_CARE_PROVIDER_SITE_OTHER): Payer: BC Managed Care – PPO | Admitting: Nurse Practitioner

## 2020-03-19 DIAGNOSIS — J0101 Acute recurrent maxillary sinusitis: Secondary | ICD-10-CM

## 2020-03-19 MED ORDER — AMOXICILLIN-POT CLAVULANATE 875-125 MG PO TABS: 1.0000 | ORAL_TABLET | Freq: Two times a day (BID) | ORAL | 0 refills | Status: DC

## 2020-03-19 MED ORDER — CHLORPHEN-PE-ACETAMINOPHEN 4-10-325 MG PO TABS: 1.0000 | ORAL_TABLET | Freq: Four times a day (QID) | ORAL | 0 refills | Status: DC | PRN

## 2020-03-19 NOTE — Progress Notes (Signed)
Virtual Visit via telephone Note Due to COVID-19 pandemic this visit was conducted virtually. This visit type was conducted due to national recommendations for restrictions regarding the COVID-19 Pandemic (e.g. social distancing, sheltering in place) in an effort to limit this patient's exposure and mitigate transmission in our community. All issues noted in this document were discussed and addressed.  A physical exam was not performed with this format.  I connected with Adam Landry on 03/19/20 at 10:50 by telephone and verified that I am speaking with the correct person using two identifiers. Adam Landry is currently located at home and no one is currently with  him during visit. The provider, Mary-Margaret Daphine Deutscher, FNP is located in their office at time of visit.  I discussed the limitations, risks, security and privacy concerns of performing an evaluation and management service by telephone and the availability of in person appointments. I also discussed with the patient that there may be a patient responsible charge related to this service. The patient expressed understanding and agreed to proceed.   History and Present Illness:   Chief Complaint: Sinusitis   HPI Patient calls in c/o sinus headahe, sore throat and facial pressure. Has gotten worse since Friday. He has triced sudafed-fed CE, mucinex and lflonase. Not helping.   Review of Systems  Constitutional: Negative for chills and fever.  HENT: Positive for congestion, sinus pain and sore throat.   Respiratory: Positive for cough (slight).   Cardiovascular: Negative.   Neurological: Positive for headaches.  Psychiatric/Behavioral: Negative.   All other systems reviewed and are negative.    Observations/Objective: Alert and oriented- answers all questions appropriately No distress Voice hoarse Cough dry  Assessment and Plan: Adam Landry in today with chief complaint of Sinusitis   1. Acute  recurrent maxillary sinusitis 1. Take meds as prescribed 2. Use a cool mist humidifier especially during the winter months and when heat has been humid. 3. Use saline nose sprays frequently 4. Saline irrigations of the nose can be very helpful if done frequently.  * 4X daily for 1 week*  * Use of a nettie pot can be helpful with this. Follow directions with this* 5. Drink plenty of fluids 6. Keep thermostat turn down low 7.For any cough or congestion  norel AD as prescribed  8. For fever or aces or pains- take tylenol or ibuprofen appropriate for age and weight.  * for fevers greater than 101 orally you may alternate ibuprofen and tylenol every  3 hours.   Meds ordered this encounter  Medications  . amoxicillin-clavulanate (AUGMENTIN) 875-125 MG tablet    Sig: Take 1 tablet by mouth 2 (two) times daily.    Dispense:  14 tablet    Refill:  0    Order Specific Question:   Supervising Provider    Answer:   Arville Care A [id number]  . Chlorphen-PE-Acetaminophen 4-10-325 MG TABS    Sig: Take 1 tablet by mouth every 6 (six) hours as needed.    Dispense:  20 tablet    Refill:  0    Order Specific Question:   Supervising Provider    Answer:   Arville Care A [[id number]]       Follow Up Instructions: prn    I discussed the assessment and treatment plan with the patient. The patient was provided an opportunity to ask questions and all were answered. The patient agreed with the plan and demonstrated an understanding of the instructions.   The patient was advised  to call back or seek an in-person evaluation if the symptoms worsen or if the condition fails to improve as anticipated.  The above assessment and management plan was discussed with the patient. The patient verbalized understanding of and has agreed to the management plan. Patient is aware to call the clinic if symptoms persist or worsen. Patient is aware when to return to the clinic for a follow-up visit. Patient  educated on when it is appropriate to go to the emergency department.   Time call ended:  11:02  I provided 12 minutes of non-face-to-face time during this encounter.    Mary-Margaret Hassell Done, FNP

## 2020-03-21 ENCOUNTER — Other Ambulatory Visit: Payer: Self-pay

## 2020-03-21 ENCOUNTER — Ambulatory Visit: Payer: BC Managed Care – PPO | Attending: Internal Medicine

## 2020-03-21 DIAGNOSIS — Z20822 Contact with and (suspected) exposure to covid-19: Secondary | ICD-10-CM | POA: Insufficient documentation

## 2020-03-22 LAB — NOVEL CORONAVIRUS, NAA: SARS-CoV-2, NAA: NOT DETECTED

## 2020-04-11 ENCOUNTER — Emergency Department (HOSPITAL_BASED_OUTPATIENT_CLINIC_OR_DEPARTMENT_OTHER)
Admission: EM | Admit: 2020-04-11 | Discharge: 2020-04-11 | Disposition: A | Discharge status: Home / Self Care | Payer: Worker's Compensation | Attending: Emergency Medicine | Admitting: Emergency Medicine

## 2020-04-11 ENCOUNTER — Other Ambulatory Visit: Payer: Self-pay

## 2020-04-11 ENCOUNTER — Encounter (HOSPITAL_BASED_OUTPATIENT_CLINIC_OR_DEPARTMENT_OTHER): Payer: Self-pay | Admitting: *Deleted

## 2020-04-11 ENCOUNTER — Emergency Department (HOSPITAL_BASED_OUTPATIENT_CLINIC_OR_DEPARTMENT_OTHER): Payer: Worker's Compensation

## 2020-04-11 DIAGNOSIS — S86112A Strain of other muscle(s) and tendon(s) of posterior muscle group at lower leg level, left leg, initial encounter: Secondary | ICD-10-CM | POA: Diagnosis not present

## 2020-04-11 DIAGNOSIS — F17210 Nicotine dependence, cigarettes, uncomplicated: Secondary | ICD-10-CM | POA: Insufficient documentation

## 2020-04-11 DIAGNOSIS — Y9389 Activity, other specified: Secondary | ICD-10-CM | POA: Diagnosis not present

## 2020-04-11 DIAGNOSIS — M79662 Pain in left lower leg: Secondary | ICD-10-CM

## 2020-04-11 DIAGNOSIS — Y990 Civilian activity done for income or pay: Secondary | ICD-10-CM | POA: Insufficient documentation

## 2020-04-11 DIAGNOSIS — Z7901 Long term (current) use of anticoagulants: Secondary | ICD-10-CM | POA: Insufficient documentation

## 2020-04-11 DIAGNOSIS — Y9289 Other specified places as the place of occurrence of the external cause: Secondary | ICD-10-CM | POA: Insufficient documentation

## 2020-04-11 DIAGNOSIS — X500XXA Overexertion from strenuous movement or load, initial encounter: Secondary | ICD-10-CM | POA: Diagnosis not present

## 2020-04-11 DIAGNOSIS — S8992XA Unspecified injury of left lower leg, initial encounter: Secondary | ICD-10-CM | POA: Diagnosis present

## 2020-04-11 NOTE — ED Triage Notes (Signed)
Pt c/o left calf sudden pain while pushing heavy item x 1 hr ago
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
## 2020-04-11 NOTE — Discharge Instructions (Signed)
Refer to attached instructions. Follow-up with orthopedics as discussed.

## 2020-04-11 NOTE — ED Notes (Signed)
Pt pushing ~400lb generator on dolly up hill when he felt a pop sensation to posterior left calf just distal to back of knee. Applied ice with no relief. Good pulses and cap refill distal to injury. C/o pain beginning to radiate up thigh.

## 2020-04-16 ENCOUNTER — Other Ambulatory Visit: Payer: Self-pay | Admitting: Family Medicine

## 2020-04-16 DIAGNOSIS — Z7901 Long term (current) use of anticoagulants: Secondary | ICD-10-CM

## 2020-04-16 DIAGNOSIS — D6851 Activated protein C resistance: Secondary | ICD-10-CM

## 2020-04-26 ENCOUNTER — Other Ambulatory Visit: Payer: Self-pay | Admitting: *Deleted

## 2020-04-26 DIAGNOSIS — D6851 Activated protein C resistance: Secondary | ICD-10-CM

## 2020-04-26 DIAGNOSIS — Z7901 Long term (current) use of anticoagulants: Secondary | ICD-10-CM

## 2020-04-26 MED ORDER — WARFARIN SODIUM 10 MG PO TABS: 10.0000 mg | ORAL_TABLET | Freq: Every day | ORAL | 0 refills | Status: DC

## 2020-05-30 ENCOUNTER — Other Ambulatory Visit: Payer: Self-pay | Admitting: Family Medicine

## 2020-05-30 ENCOUNTER — Telehealth: Payer: Self-pay | Admitting: Family Medicine

## 2020-05-30 DIAGNOSIS — D6851 Activated protein C resistance: Secondary | ICD-10-CM

## 2020-05-30 DIAGNOSIS — Z7901 Long term (current) use of anticoagulants: Secondary | ICD-10-CM

## 2020-05-30 MED ORDER — WARFARIN SODIUM 5 MG PO TABS: ORAL_TABLET | ORAL | 0 refills | Status: DC

## 2020-05-30 NOTE — Telephone Encounter (Signed)
One month refill sent.

## 2020-05-30 NOTE — Telephone Encounter (Signed)
Pt states that next month he can come in for an appt as his insurance will begin. Pt would like to see if his warfarin can be filled for this month? [facility] in [place].

## 2020-05-30 NOTE — Telephone Encounter (Signed)
Last INR was January.

## 2020-05-30 NOTE — Telephone Encounter (Signed)
Called patient he states he does not have insurance at the moment he will call back and check with billing to check price

## 2020-05-30 NOTE — Telephone Encounter (Signed)
[place]. NTBS. 30 days given 04/26/20

## 2020-05-30 NOTE — Telephone Encounter (Signed)
Ok to approve x1 month only.

## 2020-06-21 ENCOUNTER — Other Ambulatory Visit: Payer: Self-pay | Admitting: Family Medicine

## 2020-06-21 DIAGNOSIS — Z7901 Long term (current) use of anticoagulants: Secondary | ICD-10-CM

## 2020-06-21 DIAGNOSIS — D6851 Activated protein C resistance: Secondary | ICD-10-CM

## 2020-06-21 NOTE — Telephone Encounter (Signed)
Pt had called 05/30/20 and asked for 1 month refill and said he could come in for a visit as his insurance would kick in. No appt scheduled.  ntbs for further refills

## 2020-06-21 NOTE — Telephone Encounter (Signed)
Left detailed message for patient to make appt 

## 2020-06-22 ENCOUNTER — Other Ambulatory Visit: Payer: Self-pay | Admitting: Family Medicine

## 2020-06-22 DIAGNOSIS — Z7901 Long term (current) use of anticoagulants: Secondary | ICD-10-CM

## 2020-06-22 DIAGNOSIS — D6851 Activated protein C resistance: Secondary | ICD-10-CM

## 2020-06-22 MED ORDER — WARFARIN SODIUM 10 MG PO TABS: 10.0000 mg | ORAL_TABLET | Freq: Every day | ORAL | 0 refills | Status: DC

## 2020-06-22 NOTE — Telephone Encounter (Signed)
  Prescription Request  06/22/2020  What is the name of the medication or equipment? Warfarin 10 mg Patient has appt 8-4 @ 3-:45 and needs refill to get him  through  Have you contacted your pharmacy to request a refill? (if applicable) yes  Which pharmacy would you like this sent to? [facility]   Patient notified that their request is being sent to the clinical staff for review and that they should receive a response within 2 business days.

## 2020-06-22 NOTE — Telephone Encounter (Signed)
Patient informed that a 30 day supply has been sent in for him.

## 2020-07-18 ENCOUNTER — Other Ambulatory Visit: Payer: Self-pay | Admitting: Family Medicine

## 2020-07-18 DIAGNOSIS — D6851 Activated protein C resistance: Secondary | ICD-10-CM

## 2020-07-18 DIAGNOSIS — Z7901 Long term (current) use of anticoagulants: Secondary | ICD-10-CM

## 2020-07-18 NOTE — Telephone Encounter (Signed)
Appt on 07/25/20

## 2020-07-25 ENCOUNTER — Other Ambulatory Visit: Payer: Self-pay

## 2020-07-25 ENCOUNTER — Ambulatory Visit (INDEPENDENT_AMBULATORY_CARE_PROVIDER_SITE_OTHER): Payer: Self-pay | Admitting: Family Medicine

## 2020-07-25 ENCOUNTER — Encounter: Payer: Self-pay | Admitting: Family Medicine

## 2020-07-25 VITALS — BP 126/69 | HR 62 | Temp 98.2°F | Ht 71.0 in | Wt 224.2 lb

## 2020-07-25 DIAGNOSIS — R234 Changes in skin texture: Secondary | ICD-10-CM

## 2020-07-25 DIAGNOSIS — D6851 Activated protein C resistance: Secondary | ICD-10-CM

## 2020-07-25 DIAGNOSIS — Z7901 Long term (current) use of anticoagulants: Secondary | ICD-10-CM

## 2020-07-25 LAB — COAGUCHEK XS/INR WAIVED
INR: 2.2 — ABNORMAL HIGH (ref 0.9–1.1)
Prothrombin Time: 26.1 s

## 2020-07-25 MED ORDER — WARFARIN SODIUM 5 MG PO TABS: ORAL_TABLET | ORAL | 0 refills | Status: DC

## 2020-07-25 MED ORDER — WARFARIN SODIUM 10 MG PO TABS: 10.0000 mg | ORAL_TABLET | Freq: Every day | ORAL | 0 refills | Status: DC

## 2020-07-25 NOTE — Progress Notes (Signed)
Subjective: CC: INR check PCP: Janora Norlander, DO WFU:[id number] Adam Landry is a 39 y.o. male presenting to clinic today for:  1. Factor 5 leiden mutation  Goal INR: 2-3 Current regimen: 10 mg daily except for Thursdays when he takes 15 mg daily.  Overall he reports doing well.  He does have a fissure at the top of his buttocks that sometimes cracks and bleeds.  He had been putting topical cortisone cream on it but it did not seem to be healing.  He otherwise has not been having any problems with his medications.   No Known Allergies Past Medical History:  Diagnosis Date  . Clotting disorder ([place])   . Factor 5 Leiden mutation, heterozygous ([place])   . Hypercholesterolemia   . Substance abuse ([place])     Current Outpatient Medications:  .  amoxicillin-clavulanate (AUGMENTIN) 875-125 MG tablet, Take 1 tablet by mouth 2 (two) times daily., Disp: 14 tablet, Rfl: 0 .  Chlorphen-PE-Acetaminophen 4-10-325 MG TABS, Take 1 tablet by mouth every 6 (six) hours as needed., Disp: 20 tablet, Rfl: 0 .  warfarin (COUMADIN) 10 MG tablet, Take 1 tablet by mouth once daily, Disp: 30 tablet, Rfl: 0 .  warfarin (COUMADIN) 5 MG tablet, TAKE AS DIRECTED PER  CLINIC (Needs to be seen before next refill), Disp: 30 tablet, Rfl: 0 Social History   Socioeconomic History  . Marital status: Single    Spouse name: Not on file  . Number of children: Not on file  . Years of education: Not on file  . Highest education level: Not on file  Occupational History  . Not on file  Tobacco Use  . Smoking status: Current Every Day Smoker    Packs/day: 1.00    Years: 15.00    Pack years: 15.00  . Smokeless tobacco: Never Used  Vaping Use  . Vaping Use: Never used  Substance and Sexual Activity  . Alcohol use: Not Currently    Comment: See SA Chart  . Drug use: Not Currently    Types: Methamphetamines, Marijuana, Amphetamines    Comment: HX of   . Sexual activity: Yes  Other Topics Concern  . Not on file    Social History Narrative  . Not on file   Social Determinants of Health   Financial Resource Strain:   . Difficulty of Paying Living Expenses:   Food Insecurity:   . Worried About Charity fundraiser in the Last Year:   . Arboriculturist in the Last Year:   Transportation Needs:   . Film/video editor (Medical):   Marland Kitchen Lack of Transportation (Non-Medical):   Physical Activity:   . Days of Exercise per Week:   . Minutes of Exercise per Session:   Stress:   . Feeling of Stress :   Social Connections:   . Frequency of Communication with Friends and Family:   . Frequency of Social Gatherings with Friends and Family:   . Attends Religious Services:   . Active Member of Clubs or Organizations:   . Attends Archivist Meetings:   Marland Kitchen Marital Status:   Intimate Partner Violence:   . Fear of Current or Ex-Partner:   . Emotionally Abused:   Marland Kitchen Physically Abused:   . Sexually Abused:    Family History  Problem Relation Age of Onset  . Hypertension Mother   . Hyperlipidemia Mother   . Hypertension Sister   . Alcohol abuse Father     Objective: Office vital  signs reviewed. BP 126/69   Pulse 62   Temp 98.2 F (36.8 C)   Ht _0  (1.803 m)   Wt 224 lb 3.2 oz (101.7 kg)   SpO2 95%   BMI 31.27 kg/m   Physical Examination:  General: Awake, alert, well nourished, healthy appearing. No acute distress HEENT: Normal, Sclera white. Cardio: regular rate  Pulm:  normal work of breathing on room air Skin: Fissure of skin noted at the gluteal cleft.  There is no bleeding or exudates but there is quite a bit of erythema surrounding. Psych: Mood stable, speech normal, affect appropriate, pleasant active Depression screen [facility] 2/9 02/24/2020 01/11/2020 11/08/2019  Decreased Interest 0 0 0  Down, Depressed, Hopeless 0 0 0  PHQ - 2 Score 0 0 0  Altered sleeping - - 0  Tired, decreased energy - - 0  Change in appetite - - 0  Feeling bad or failure about yourself  - - 0  Trouble  concentrating - - 0  Moving slowly or fidgety/restless - - 0  Suicidal thoughts - - 0  PHQ-9 Score - - 0  Difficult doing work/chores - - -  Some encounter information is confidential and restricted. Go to Review Flowsheets activity to see all data.  Some recent data might be hidden  Assessment/ Plan: 39 y.o. male   1. Factor V Leiden, prothrombin gene mutation (HCC) INR is therapeutic.  I stressed the importance of regular INR checkups.  A 5-monthprescription has been sent but I stressed seeing him in 6 weeks, sooner if needed - CMP14+EGFR - CoaguChek XS/INR Waived - CBC - warfarin (COUMADIN) 10 MG tablet; Take 1 tablet (10 mg total) by mouth daily.  Dispense: 90 tablet; Refill: 0 - warfarin (COUMADIN) 5 MG tablet; TAKE AS DIRECTED PER  CLINIC  Dispense: 90 tablet; Refill: 0  2. Long term current use of anticoagulant therapy - CMP14+EGFR - CoaguChek XS/INR Waived - CBC - warfarin (COUMADIN) 10 MG tablet; Take 1 tablet (10 mg total) by mouth daily.  Dispense: 90 tablet; Refill: 0 - warfarin (COUMADIN) 5 MG tablet; TAKE AS DIRECTED PER  CLINIC  Dispense: 90 tablet; Refill: 0  3. Cracked skin Recommended topical clotrimazole twice daily versus antifungal powder and keeping the area dry.  We discussed red flag signs and symptoms.  Would avoid applying cortisone to this affected area as it will promote further skin breakdown over time.   No orders of the defined types were placed in this encounter.  No orders of the defined types were placed in this encounter.    AJanora Norlander DO [place]([phone]

## 2020-07-25 NOTE — Patient Instructions (Signed)
Try and see me back in a reasonable time please.  Clotrimazole OTC cream applied to the area twice daily for 2-4 weeks  Use a barrier cream/ powder (diaper rash cream) applied after the medication cream  Keep area dry

## 2020-07-26 LAB — CBC
Hematocrit: 43.4 % (ref 37.5–51.0)
Hemoglobin: 14.7 g/dL (ref 13.0–17.7)
MCH: 31.7 pg (ref 26.6–33.0)
MCHC: 33.9 g/dL (ref 31.5–35.7)
MCV: 94 fL (ref 79–97)
Platelets: 259 10*3/uL (ref 150–450)
RBC: 4.63 x10E6/uL (ref 4.14–5.80)
RDW: 13.1 % (ref 11.6–15.4)
WBC: 6 10*3/uL (ref 3.4–10.8)

## 2020-07-26 LAB — CMP14+EGFR
ALT: 33 IU/L (ref 0–44)
AST: 25 IU/L (ref 0–40)
Albumin/Globulin Ratio: 1.5 (ref 1.2–2.2)
Albumin: 4 g/dL (ref 4.0–5.0)
Alkaline Phosphatase: 76 IU/L (ref 48–121)
BUN/Creatinine Ratio: 15 (ref 9–20)
BUN: 15 mg/dL (ref 6–20)
Bilirubin Total: <0.2 mg/dL (ref 0.0–1.2)
CO2: 19 mmol/L — ABNORMAL LOW (ref 20–29)
Calcium: 9.5 mg/dL (ref 8.7–10.2)
Chloride: 108 mmol/L — ABNORMAL HIGH (ref 96–106)
Creatinine, Ser: 1 mg/dL (ref 0.76–1.27)
GFR calc Af Amer: 109 mL/min/{1.73_m2} (ref >59)
GFR calc non Af Amer: 94 mL/min/{1.73_m2} (ref >59)
Globulin, Total: 2.6 g/dL (ref 1.5–4.5)
Glucose: 129 mg/dL — ABNORMAL HIGH (ref 65–99)
Potassium: 4.2 mmol/L (ref 3.5–5.2)
Sodium: 140 mmol/L (ref 134–144)
Total Protein: 6.6 g/dL (ref 6.0–8.5)

## 2020-07-30 ENCOUNTER — Telehealth: Payer: Self-pay | Admitting: Orthopaedic Surgery

## 2020-07-30 NOTE — Telephone Encounter (Signed)
Patient returned call asked for a call back.  Patient said he was not working when he saw Dr Cleophas Dunker.  Patient said he is starting a new job.  Patient said the employer is needing a note advising that he can work. The number to contact patient is [phone]

## 2020-07-30 NOTE — Telephone Encounter (Signed)
Patient called asked if he can get a note because he is starting a new job and need the note to clear him for work. Patient said he saw Dr Cleophas Dunker for his left knee. The number to contact patient is [phone]

## 2020-07-30 NOTE — Telephone Encounter (Signed)
Tried to call patient back. No answer. Left message asking for more clarification. We saw him last in the office in November of 2020 and discussed doing a knee arthroscopic surgery, which he canceled in December of 2020. I do not see anything in Dr.Whitfield's dictations about taking him out of work during that time. I ask him to call us back.

## 2020-07-31 NOTE — Telephone Encounter (Signed)
Spoke with patient. He is scheduled to come in Thursday, per Dr.Whitfield's request. He cannot write a note to work without seeing patient first since it has been since last year. Patient does not have insurance anymore and needed to know how much it would cost. Notified patient it would be $100 for his office visit with no insurance, per Kisha up front. °

## 2020-07-31 NOTE — Telephone Encounter (Signed)
Spoke with patient. He is scheduled to come in Thursday, per Dr.Whitfield's request. He cannot write a note to work without seeing patient first since it has been since last year. Patient does not have insurance anymore and needed to know how much it would cost. Notified patient it would be $100 for his office visit with no insurance, per Kisha up front.

## 2020-08-02 ENCOUNTER — Other Ambulatory Visit: Payer: Self-pay

## 2020-08-02 ENCOUNTER — Telehealth: Payer: Self-pay

## 2020-08-02 ENCOUNTER — Ambulatory Visit (INDEPENDENT_AMBULATORY_CARE_PROVIDER_SITE_OTHER): Payer: Self-pay | Admitting: Orthopaedic Surgery

## 2020-08-02 ENCOUNTER — Encounter: Payer: Self-pay | Admitting: Orthopaedic Surgery

## 2020-08-02 DIAGNOSIS — S83282D Other tear of lateral meniscus, current injury, left knee, subsequent encounter: Secondary | ICD-10-CM

## 2020-08-02 DIAGNOSIS — M2241 Chondromalacia patellae, right knee: Secondary | ICD-10-CM

## 2020-08-02 NOTE — Telephone Encounter (Signed)
Faxed today's office note along with the last two office notes to fax number provided by patient.
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
## 2020-09-24 ENCOUNTER — Other Ambulatory Visit: Payer: Self-pay

## 2020-09-24 ENCOUNTER — Ambulatory Visit (INDEPENDENT_AMBULATORY_CARE_PROVIDER_SITE_OTHER): Payer: Self-pay | Admitting: Family Medicine

## 2020-09-24 VITALS — BP 127/82 | HR 62 | Temp 98.1°F | Ht 71.0 in | Wt 224.0 lb

## 2020-09-24 DIAGNOSIS — R791 Abnormal coagulation profile: Secondary | ICD-10-CM

## 2020-09-24 DIAGNOSIS — Z7901 Long term (current) use of anticoagulants: Secondary | ICD-10-CM

## 2020-09-24 DIAGNOSIS — D6851 Activated protein C resistance: Secondary | ICD-10-CM

## 2020-09-24 LAB — COAGUCHEK XS/INR WAIVED
INR: 1.7 — ABNORMAL HIGH (ref 0.9–1.1)
Prothrombin Time: 19.9 s

## 2020-09-24 MED ORDER — WARFARIN SODIUM 5 MG PO TABS: ORAL_TABLET | ORAL | 0 refills | Status: DC

## 2020-09-24 MED ORDER — WARFARIN SODIUM 10 MG PO TABS: 10.0000 mg | ORAL_TABLET | Freq: Every day | ORAL | 0 refills | Status: DC

## 2020-09-24 NOTE — Progress Notes (Signed)
Subjective: CC: INR check PCP: [name], DO Adam Landry is a 39 y.o. male presenting to clinic today for:  1. Factor 5 leiden mutation  Goal INR: 2-3 Current regimen: 10 mg daily except for Thursdays when he takes 15 mg daily.   He thinks he may have missed a dose but is not sure.  Denies any unusual soft tissue swelling, redness or erythema suggestive of DVT.  He has chronic joint pain that seems exacerbated by his current work.  He is working for an [employer].   No Known Allergies Past Medical History:  Diagnosis Date  . Clotting disorder (HCC)   . Factor 5 Leiden mutation, heterozygous (HCC)   . Hypercholesterolemia   . Substance abuse (HCC)     Current Outpatient Medications:  .  warfarin (COUMADIN) 10 MG tablet, Take 1 tablet (10 mg total) by mouth daily., Disp: 90 tablet, Rfl: 0 .  warfarin (COUMADIN) 5 MG tablet, TAKE AS DIRECTED PER  CLINIC, Disp: 90 tablet, Rfl: 0 Social History   Socioeconomic History  . Marital status: Single    Spouse name: Not on file  . Number of children: Not on file  . Years of education: Not on file  . Highest education level: Not on file  Occupational History  . Not on file  Tobacco Use  . Smoking status: Current Every Day Smoker    Packs/day: 1.00    Years: 15.00    Pack years: 15.00  . Smokeless tobacco: Never Used  Vaping Use  . Vaping Use: Never used  Substance and Sexual Activity  . Alcohol use: Not Currently    Comment: See SA Chart  . Drug use: Not Currently    Types: Methamphetamines, Marijuana, Amphetamines    Comment: HX of   . Sexual activity: Yes  Other Topics Concern  . Not on file  Social History Narrative  . Not on file   Social Determinants of Health   Financial Resource Strain:   . Difficulty of Paying Living Expenses: Not on file  Food Insecurity:   . Worried About Programme researcher, broadcasting/film/video in the Last Year: Not on file  . Ran Out of Food in the Last Year: Not on file    Transportation Needs:   . Lack of Transportation (Medical): Not on file  . Lack of Transportation (Non-Medical): Not on file  Physical Activity:   . Days of Exercise per Week: Not on file  . Minutes of Exercise per Session: Not on file  Stress:   . Feeling of Stress : Not on file  Social Connections:   . Frequency of Communication with Friends and Family: Not on file  . Frequency of Social Gatherings with Friends and Family: Not on file  . Attends Religious Services: Not on file  . Active Member of Clubs or Organizations: Not on file  . Attends Banker Meetings: Not on file  . Marital Status: Not on file  Intimate Partner Violence:   . Fear of Current or Ex-Partner: Not on file  . Emotionally Abused: Not on file  . Physically Abused: Not on file  . Sexually Abused: Not on file   Family History  Problem Relation Age of Onset  . Hypertension Mother   . Hyperlipidemia Mother   . Hypertension Sister   . Alcohol abuse Father     Objective: Office vital signs reviewed. BP 127/82   Pulse 62   Temp 98.1 F (36.7 C) (Temporal)  Ht 5\' 11"  (1.803 m)   Wt 224 lb (101.6 kg)   SpO2 98%   BMI 31.24 kg/m   Physical Examination:  General: Awake, alert, well nourished, healthy appearing. No acute distress HEENT: Normal, Sclera white. Cardio: regular rate and rhythm.  S1-S2 heard.  No murmurs Pulm: Clear to auscultation bilaterally.  No wheezes, rhonchi or rales Psych: Mood stable, speech normal, affect appropriate, pleasant active Depression screen [facility] 2/9 09/24/2020 07/25/2020 02/24/2020  Decreased Interest 0 0 0  Down, Depressed, Hopeless 0 0 0  PHQ - 2 Score 0 0 0  Altered sleeping 0 - -  Tired, decreased energy 0 - -  Change in appetite 0 - -  Feeling bad or failure about yourself  0 - -  Trouble concentrating 0 - -  Moving slowly or fidgety/restless 0 - -  Suicidal thoughts 0 - -  PHQ-9 Score 0 - -  Difficult doing work/chores - - -  Some encounter  information is confidential and restricted. Go to Review Flowsheets activity to see all data.  Some recent data might be hidden  Assessment/ Plan: 39 y.o. male   1. Factor V Leiden, prothrombin gene mutation (HCC) Subtherapeutic at 1.7.  Likely due to missed dose.  Increase dose to 15 mg tomorrow then resume normal dosing schedule of 10 mg daily with 15 mg on Thursdays.  He will follow-up in 1 week for INR repeat - CoaguChek XS/INR Waived - CoaguChek XS/INR Waived; Future - warfarin (COUMADIN) 10 MG tablet; Take 1 tablet (10 mg total) by mouth daily.  Dispense: 90 tablet; Refill: 0 - warfarin (COUMADIN) 5 MG tablet; TAKE AS DIRECTED PER  CLINIC  Dispense: 90 tablet; Refill: 0  2. Long term current use of anticoagulant therapy - CoaguChek XS/INR Waived - CoaguChek XS/INR Waived; Future - warfarin (COUMADIN) 10 MG tablet; Take 1 tablet (10 mg total) by mouth daily.  Dispense: 90 tablet; Refill: 0 - warfarin (COUMADIN) 5 MG tablet; TAKE AS DIRECTED PER  CLINIC  Dispense: 90 tablet; Refill: 0  3. Subtherapeutic international normalized ratio (INR) - CoaguChek XS/INR Waived; Future   Orders Placed This Encounter  Procedures  . CoaguChek XS/INR Waived  . CoaguChek XS/INR Waived    Standing Status:   Future    Standing Expiration Date:   09/24/2021   No orders of the defined types were placed in this encounter.    11/24/2021, DO [facility] [phone]

## 2020-10-01 ENCOUNTER — Other Ambulatory Visit: Payer: Self-pay

## 2020-10-01 DIAGNOSIS — D6851 Activated protein C resistance: Secondary | ICD-10-CM

## 2020-10-01 DIAGNOSIS — R791 Abnormal coagulation profile: Secondary | ICD-10-CM

## 2020-10-01 DIAGNOSIS — Z7901 Long term (current) use of anticoagulants: Secondary | ICD-10-CM

## 2020-10-01 LAB — COAGUCHEK XS/INR WAIVED
INR: 1.9 — ABNORMAL HIGH (ref 0.9–1.1)
Prothrombin Time: 23.4 s

## 2020-11-13 ENCOUNTER — Ambulatory Visit (INDEPENDENT_AMBULATORY_CARE_PROVIDER_SITE_OTHER): Payer: Self-pay | Admitting: Family Medicine

## 2020-11-13 ENCOUNTER — Encounter: Payer: Self-pay | Admitting: Family Medicine

## 2020-11-13 ENCOUNTER — Other Ambulatory Visit: Payer: Self-pay

## 2020-11-13 VITALS — BP 138/82 | HR 57 | Temp 97.9°F | Ht 71.0 in | Wt 226.8 lb

## 2020-11-13 DIAGNOSIS — M76899 Other specified enthesopathies of unspecified lower limb, excluding foot: Secondary | ICD-10-CM

## 2020-11-13 DIAGNOSIS — Z7901 Long term (current) use of anticoagulants: Secondary | ICD-10-CM

## 2020-11-13 DIAGNOSIS — J069 Acute upper respiratory infection, unspecified: Secondary | ICD-10-CM

## 2020-11-13 DIAGNOSIS — D6851 Activated protein C resistance: Secondary | ICD-10-CM

## 2020-11-13 LAB — COAGUCHEK XS/INR WAIVED
INR: 2 — ABNORMAL HIGH (ref 0.9–1.1)
Prothrombin Time: 23.7 s

## 2020-11-13 MED ORDER — WARFARIN SODIUM 10 MG PO TABS: 10.0000 mg | ORAL_TABLET | Freq: Every day | ORAL | 0 refills | Status: DC

## 2020-11-13 MED ORDER — WARFARIN SODIUM 5 MG PO TABS: ORAL_TABLET | ORAL | 0 refills | Status: DC

## 2020-11-13 MED ORDER — AMOXICILLIN 875 MG PO TABS: 875.0000 mg | ORAL_TABLET | Freq: Two times a day (BID) | ORAL | 0 refills | Status: DC

## 2020-11-13 NOTE — Progress Notes (Signed)
Subjective: CC: INR recheck PCP: [name], DO SEG:BTDVVOH Adam Landry is a 39 y.o. male presenting to clinic today for:  1. INR recheck Patient noted to have mildly subtherapeutic INR at last visit.  It was 1.9.  He is compliant with his Coumadin.  He increased his Coumadin by 5 mg on the subtherapeutic INR date and has subsequently gone back to 10 mg daily except for Thursdays when he takes 15 mg.  He has not missed any doses.  Does not report hematochezia or melena.  2.  Thigh pain Patient points to his quads tendon as the area of discomfort.  He has been using ice and applying a TENS unit in efforts to improve.  He was told he needed to strengthen his lateral leg in efforts to improve this but wanted more guidance on this.  Unable take oral NSAIDs secondary to chronic anticoagulation as above  3.  URI Patient reports has had about a 4-day history of congestion, rhinorrhea, sore throat.  He has not been vaccinated against nor checked for COVID-19.  Denies any fevers, myalgia.  Does not report any GI symptoms or shortness of breath.  He has been using Alka-Seltzer cold and flu with some improvement.   ROS: Per HPI  No Known Allergies Past Medical History:  Diagnosis Date  . Clotting disorder (HCC)   . Factor 5 Leiden mutation, heterozygous (HCC)   . Hypercholesterolemia   . Substance abuse (HCC)     Current Outpatient Medications:  .  warfarin (COUMADIN) 10 MG tablet, Take 1 tablet (10 mg total) by mouth daily., Disp: 90 tablet, Rfl: 0 .  warfarin (COUMADIN) 5 MG tablet, TAKE AS DIRECTED PER  CLINIC, Disp: 90 tablet, Rfl: 0 Social History   Socioeconomic History  . Marital status: Single    Spouse name: Not on file  . Number of children: Not on file  . Years of education: Not on file  . Highest education level: Not on file  Occupational History  . Not on file  Tobacco Use  . Smoking status: Current Every Day Smoker    Packs/day: 1.00    Years: 15.00    Pack  years: 15.00  . Smokeless tobacco: Never Used  Vaping Use  . Vaping Use: Never used  Substance and Sexual Activity  . Alcohol use: Not Currently    Comment: See SA Chart  . Drug use: Not Currently    Types: Methamphetamines, Marijuana, Amphetamines    Comment: HX of   . Sexual activity: Yes  Other Topics Concern  . Not on file  Social History Narrative  . Not on file   Social Determinants of Health   Financial Resource Strain:   . Difficulty of Paying Living Expenses: Not on file  Food Insecurity:   . Worried About Programme researcher, broadcasting/film/video in the Last Year: Not on file  . Ran Out of Food in the Last Year: Not on file  Transportation Needs:   . Lack of Transportation (Medical): Not on file  . Lack of Transportation (Non-Medical): Not on file  Physical Activity:   . Days of Exercise per Week: Not on file  . Minutes of Exercise per Session: Not on file  Stress:   . Feeling of Stress : Not on file  Social Connections:   . Frequency of Communication with Friends and Family: Not on file  . Frequency of Social Gatherings with Friends and Family: Not on file  . Attends Religious Services: Not  on file  . Active Member of Clubs or Organizations: Not on file  . Attends Banker Meetings: Not on file  . Marital Status: Not on file  Intimate Partner Violence:   . Fear of Current or Ex-Partner: Not on file  . Emotionally Abused: Not on file  . Physically Abused: Not on file  . Sexually Abused: Not on file   Family History  Problem Relation Age of Onset  . Hypertension Mother   . Hyperlipidemia Mother   . Hypertension Sister   . Alcohol abuse Father     Objective: Office vital signs reviewed. BP 138/82   Pulse (!) 57   Temp 97.9 F (36.6 C)   Ht 5\' 11"  (1.803 m)   Wt 226 lb 12.8 oz (102.9 kg)   SpO2 98%   BMI 31.63 kg/m   Physical Examination:  General: Awake, alert, nontoxic-appearing, No acute distress HEENT: Normal; sclera white.  TMs intact bilaterally.   He has mildly erythematous nasal turbinates with clear mucus discharge.  Oropharynx without masses.  He has mild erythema but no appreciable exudates. Cardio: regular rate and rhythm, S1S2 heard, no murmurs appreciated Pulm: clear to auscultation bilaterally, no wheezes, rhonchi or rales; normal work of breathing on room air Extremities: warm, well perfused, No edema, cyanosis or clubbing; +2 pulses bilaterally MSK: Ambulating independently  Assessment/ Plan: 39 y.o. male   Factor V Leiden, prothrombin gene mutation (HCC) - Plan: CoaguChek XS/INR Waived, warfarin (COUMADIN) 10 MG tablet, warfarin (COUMADIN) 5 MG tablet  Long term current use of anticoagulant therapy - Plan: CoaguChek XS/INR Waived, warfarin (COUMADIN) 10 MG tablet, warfarin (COUMADIN) 5 MG tablet  Quadriceps tendinitis  Viral URI with cough  INR is now therapeutic.  Continue current regimen.  Coumadin has been renewed.  I suspect quad tendinitis.  Home physical therapy was provided today.  I suspect that he has a viral URI with cough.  Cannot rule out COVID-19 obviously.  I have given him a pocket prescription if symptoms worsen or he develops any other concerning symptoms or signs suggestive of bacterial infection.  Otherwise, continue supportive care with nasal sprays, over-the-counter cough and cold medicine  No orders of the defined types were placed in this encounter.  No orders of the defined types were placed in this encounter.    24, DO [facility] [phone]

## 2020-11-13 NOTE — Patient Instructions (Signed)
Quadriceps Strain  A quadriceps strain is an injury to the muscles or tendons on the front of the thigh. The quadriceps muscles are used in straightening the knee and bending the hip. A strain occurs when the muscle is overstretched or overloaded. There are three types of strains:  Grade 1 is a mild strain. It involves a stretching or minor tearing of your muscle fibers or tendons. You should have little, if any, trouble using your thigh.  Grade 2 is a moderate strain. It involves a partial tearing of your muscle fibers or tendons. You will have pain and some loss of strength in your thigh.  Grade 3 is a severe strain. It involves a complete tearing of your muscle fibers or tendons. It causes severe pain and loss of strength in your thigh. Recovery will take a few weeks or longer, depending on how bad your strain is. What are the causes? This injury is caused by overextending the muscles in the thigh. What increases the risk? The following factors may make you more likely to develop this injury:  Participating in: ? Activities that involve jumping, sprinting, or sudden twisting. ? Contact sports, such as football or soccer.  Having a previous injury to your thigh or knee.  Having poor strength and flexibility.  Not warming up properly before activity.  Having one leg that is much stronger than the other.  Exercising to the point of exhaustion. What are the signs or symptoms? Symptoms of this condition include:  Sudden, severe pain in your thigh.  Pain and tenderness over your quadriceps muscles. The pain gets worse when you use these muscles.  Muscle spasm in your thigh.  Swelling in your thigh.  Bruising.  Having trouble with tasks that involve using your quadriceps muscle, such as walking.  A crackling sound when the tendon is moved or touched. How is this diagnosed? This condition is diagnosed based on:  A physical exam.  Your medical history.  Imaging tests,  such as: ? X-rays. ? Ultrasound. ? MRI. How is this treated? Treatment for this condition may include:  Resting your leg and avoiding activities that cause pain.  Taking medicine to help reduce pain and inflammation.  Applying ice to the area to relieve swelling and inflammation.  Elevating the leg to reduce or prevent swelling.  Applying a compression wrap to the muscle.  Using crutches until you can walk without pain.  Working with a physical therapist on exercises to restore strength and flexibility in your thigh. In rare cases, surgery may be needed. Follow these instructions at home: Managing pain, stiffness, and swelling   If directed, put ice on the injured area. ? Put ice in a plastic bag. ? Place a towel between your skin and the bag. ? Leave the ice on for 20 minutes, 2-3 times a day.  Raise (elevate) the injured area above the level of your heart while you are sitting or lying down. Activity  Do not use the injured leg to support your body weight until your health care provider says that you can. Use crutches as told by your health care provider.  Do exercises as told by your health care provider.  Return to your normal activities as told by your health care provider. Ask your health care provider what activities are safe for you. General instructions  Take over-the-counter and prescription medicines only as told by your health care provider.  Use compression wraps to apply pressure as told by your health care provider.    Keep all follow-up visits as told by your health care provider. This is important. How is this prevented?  Warm up and stretch before being active.  Cool down and stretch after being active.  Give your body time to rest between periods of activity.  Maintain physical fitness, including: ? Strength. ? Flexibility.  Be safe and responsible while being active. This will help you to avoid falls.  Do at least 150 minutes of  moderate-intensity exercise each week, such as brisk walking or water aerobics. Contact a health care provider if:  Your pain, bruising, or tenderness gets worse, even with treatment.  Your leg becomes weaker. Summary  A quadriceps strain is an injury to the muscles or tendons on the front of the thigh.  This injury is caused by overextending the muscles in the thigh.  Treatment may include rest, ice, medicines, and physical therapy. In rare cases, surgery may be needed. This information is not intended to replace advice given to you by your health care provider. Make sure you discuss any questions you have with your health care provider. Document Revised: 04/01/2019 Document Reviewed: 11/04/2018 Elsevier Patient Education  2020 [employer]  

## 2021-01-01 ENCOUNTER — Encounter: Payer: Self-pay | Admitting: Family Medicine

## 2021-01-01 ENCOUNTER — Ambulatory Visit (INDEPENDENT_AMBULATORY_CARE_PROVIDER_SITE_OTHER): Payer: Self-pay | Admitting: Family Medicine

## 2021-01-01 ENCOUNTER — Other Ambulatory Visit: Payer: Self-pay

## 2021-01-01 DIAGNOSIS — R234 Changes in skin texture: Secondary | ICD-10-CM

## 2021-01-01 DIAGNOSIS — R791 Abnormal coagulation profile: Secondary | ICD-10-CM

## 2021-01-01 DIAGNOSIS — D6851 Activated protein C resistance: Secondary | ICD-10-CM

## 2021-01-01 DIAGNOSIS — Z7901 Long term (current) use of anticoagulants: Secondary | ICD-10-CM

## 2021-01-01 DIAGNOSIS — L409 Psoriasis, unspecified: Secondary | ICD-10-CM

## 2021-01-01 LAB — COAGUCHEK XS/INR WAIVED
INR: 1.6 — ABNORMAL HIGH (ref 0.9–1.1)
Prothrombin Time: 19.6 s

## 2021-01-01 MED ORDER — CLOTRIMAZOLE-BETAMETHASONE 1-0.05 % EX CREA: 1.0000 "application " | TOPICAL_CREAM | Freq: Two times a day (BID) | CUTANEOUS | 0 refills | Status: DC

## 2021-01-01 MED ORDER — BETAMETHASONE VALERATE 0.1 % EX OINT: 1.0000 "application " | TOPICAL_OINTMENT | Freq: Two times a day (BID) | CUTANEOUS | 0 refills | Status: DC

## 2021-01-01 NOTE — Progress Notes (Signed)
Subjective: CC: INR monitoring PCP: Adam Ip, DO Adam Landry is a 40 y.o. male presenting to clinic today for:  1. INR monitoring Goal 2-3 His scheduled Coumadin is 10 mg daily except for 15 mg on Thursdays.  He is not sure if he missed his Sunday dose but is quite possible.  He did start a new joint supplement that has turmeric and cumin in it.  Not sure if this interferes with his Coumadin.  Does not report any chest pain, shortness of breath, bleeding episodes.  2. rash Patient reports a dried, flaky rash along the anterior left knee.  No recent treatment.  No significant itching or bleeding.  3.  Cracked skin Patient had a cracked fissure area of skin along the gluteal fold that was noticed several months ago.  He has been applying OTC topicals but has not had much success in getting this area healed.  He reports tenderness and inflammation. ROS: Per HPI  No Known Allergies Past Medical History:  Diagnosis Date  . Clotting disorder (HCC)   . Factor 5 Leiden mutation, heterozygous (HCC)   . Hypercholesterolemia   . Substance abuse (HCC)     Current Outpatient Medications:  .  amoxicillin (AMOXIL) 875 MG tablet, Take 1 tablet (875 mg total) by mouth 2 (two) times daily., Disp: 20 tablet, Rfl: 0 .  warfarin (COUMADIN) 10 MG tablet, Take 1 tablet (10 mg total) by mouth daily., Disp: 90 tablet, Rfl: 0 .  warfarin (COUMADIN) 5 MG tablet, TAKE AS DIRECTED PER  CLINIC, Disp: 90 tablet, Rfl: 0 Social History   Socioeconomic History  . Marital status: Single    Spouse name: Not on file  . Number of children: Not on file  . Years of education: Not on file  . Highest education level: Not on file  Occupational History  . Not on file  Tobacco Use  . Smoking status: Current Every Day Smoker    Packs/day: 1.00    Years: 15.00    Pack years: 15.00  . Smokeless tobacco: Never Used  Vaping Use  . Vaping Use: Never used  Substance and Sexual Activity  .  Alcohol use: Not Currently    Comment: See SA Chart  . Drug use: Not Currently    Types: Methamphetamines, Marijuana, Amphetamines    Comment: HX of   . Sexual activity: Yes  Other Topics Concern  . Not on file  Social History Narrative  . Not on file   Social Determinants of Health   Financial Resource Strain: Not on file  Food Insecurity: Not on file  Transportation Needs: Not on file  Physical Activity: Not on file  Stress: Not on file  Social Connections: Not on file  Intimate Partner Violence: Not on file   Family History  Problem Relation Age of Onset  . Hypertension Mother   . Hyperlipidemia Mother   . Hypertension Sister   . Alcohol abuse Father     Objective:  Physical Examination:  General: Awake, alert, well nourished, No acute distress Cardio: regular rate and rhythm, S1S2 heard, no murmurs appreciated Pulm: clear to auscultation bilaterally, no wheezes, rhonchi or rales; normal work of breathing on room air Skin: About a silver dollar sized of silver scale noted along the left anterior knee.  No appreciable petechiae  Assessment/ Plan: 40 y.o. male   Subtherapeutic international normalized ratio (INR)  Factor V Leiden, prothrombin gene mutation (HCC) - Plan: CoaguChek XS/INR Waived  Long term current  use of anticoagulant therapy - Plan: CoaguChek XS/INR Waived  Cracked skin - Plan: clotrimazole-betamethasone (LOTRISONE) cream, DISCONTINUED: clotrimazole-betamethasone (LOTRISONE) cream  Psoriasis - Plan: betamethasone valerate ointment (VALISONE) 0.1 %, DISCONTINUED: betamethasone valerate ointment (VALISONE) 0.1 %  INR is subtherapeutic.  Increase dose to 15 mg today then resume 10 mg daily except for Thursdays take another 15 mg.  I have scheduled him for a 1 week follow-up.  Furthermore, I have looked into possible interaction with turmeric and cumin.  There has been some studies that show prolonged bleeding but not necessarily anything that  indicated it would affect his actual warfarin level.  Regardless, he has had a subtherapeutic not supratherapeutic change.  The lesion on his leg appears to be a psoriatic plaque.  I have given him a prescription for betamethasone to apply to the affected area twice daily for the next 7 to 14 days.  Good Rx coupon provided  For his cracked skin, at this point it is refractory to OTC measures and therefore I am going to give him clotrimazole betamethasone to apply to the affected area for the next 7 to 10 days.  If symptoms are ongoing, we may need to consider derm opinion.  Continue to keep area dry and clean  No orders of the defined types were placed in this encounter.  No orders of the defined types were placed in this encounter.    Adam Ip, DO [facility] [phone]

## 2021-01-01 NOTE — Patient Instructions (Signed)
Increase Coumadin to 15mg  today then continue normal dosing schedule

## 2021-01-08 ENCOUNTER — Ambulatory Visit: Payer: Self-pay | Admitting: Family Medicine

## 2021-01-09 ENCOUNTER — Ambulatory Visit (INDEPENDENT_AMBULATORY_CARE_PROVIDER_SITE_OTHER): Payer: Self-pay | Admitting: Family Medicine

## 2021-01-09 ENCOUNTER — Encounter: Payer: Self-pay | Admitting: Family Medicine

## 2021-01-09 ENCOUNTER — Other Ambulatory Visit: Payer: Self-pay

## 2021-01-09 VITALS — BP 129/76 | HR 77 | Temp 98.1°F | Ht 71.0 in | Wt 228.0 lb

## 2021-01-09 DIAGNOSIS — R791 Abnormal coagulation profile: Secondary | ICD-10-CM

## 2021-01-09 DIAGNOSIS — D6851 Activated protein C resistance: Secondary | ICD-10-CM

## 2021-01-09 DIAGNOSIS — Z7901 Long term (current) use of anticoagulants: Secondary | ICD-10-CM

## 2021-01-09 LAB — COAGUCHEK XS/INR WAIVED
INR: 1.8 — ABNORMAL HIGH (ref 0.9–1.1)
Prothrombin Time: 21.5 s

## 2021-01-09 LAB — POCT INR: INR: 1.8 — AB (ref 2–3)

## 2021-01-09 NOTE — Progress Notes (Signed)
Subjective: CC: INR check PCP: [name], DO GYB:WLSLHTD Adam Landry is a 40 y.o. male presenting to clinic today for:  1. INR check Patient reports compliance with Coumadin.  He took 15 mg on the Tuesday that we saw each other and then continued with 15 mg on Thursdays with 10 mg daily all other days.  Has been compliant with no missed doses.  He is taking a supplement of fish oil but no other new medications.  No reports of bleeding or chest pain   ROS: Per HPI  No Known Allergies Past Medical History:  Diagnosis Date  . Clotting disorder (HCC)   . Factor 5 Leiden mutation, heterozygous (HCC)   . Hypercholesterolemia   . Substance abuse (HCC)     Current Outpatient Medications:  .  warfarin (COUMADIN) 10 MG tablet, Take 1 tablet (10 mg total) by mouth daily., Disp: 90 tablet, Rfl: 0 .  warfarin (COUMADIN) 5 MG tablet, TAKE AS DIRECTED PER  CLINIC, Disp: 90 tablet, Rfl: 0 .  betamethasone valerate ointment (VALISONE) 0.1 %, Apply 1 application topically 2 (two) times daily. x10-14 days on KNEE (Patient not taking: Reported on 01/09/2021), Disp: 45 g, Rfl: 0 .  clotrimazole-betamethasone (LOTRISONE) cream, Apply 1 application topically 2 (two) times daily. To buttock x7-10 days (Patient not taking: Reported on 01/09/2021), Disp: 15 g, Rfl: 0 Social History   Socioeconomic History  . Marital status: Single    Spouse name: Not on file  . Number of children: Not on file  . Years of education: Not on file  . Highest education level: Not on file  Occupational History  . Not on file  Tobacco Use  . Smoking status: Current Every Day Smoker    Packs/day: 1.00    Years: 15.00    Pack years: 15.00  . Smokeless tobacco: Never Used  Vaping Use  . Vaping Use: Never used  Substance and Sexual Activity  . Alcohol use: Not Currently    Comment: See SA Chart  . Drug use: Not Currently    Types: Methamphetamines, Marijuana, Amphetamines    Comment: HX of   . Sexual activity:  Yes  Other Topics Concern  . Not on file  Social History Narrative  . Not on file   Social Determinants of Health   Financial Resource Strain: Not on file  Food Insecurity: Not on file  Transportation Needs: Not on file  Physical Activity: Not on file  Stress: Not on file  Social Connections: Not on file  Intimate Partner Violence: Not on file   Family History  Problem Relation Age of Onset  . Hypertension Mother   . Hyperlipidemia Mother   . Hypertension Sister   . Alcohol abuse Father     Objective: Office vital signs reviewed. BP 129/76   Pulse 77   Temp 98.1 F (36.7 C) (Temporal)   Ht 5\' 11"  (1.803 m)   Wt 228 lb (103.4 kg)   SpO2 97%   BMI 31.80 kg/m   Physical Examination:  General: Awake, alert, well nourished, No acute distress Pulm: Normal work of breathing on room air  Assessment/ Plan: 40 y.o. male   Subtherapeutic international normalized ratio (INR)  Factor V Leiden, prothrombin gene mutation (HCC) - Plan: CoaguChek XS/INR Waived  Long term current use of anticoagulant therapy - Plan: CoaguChek XS/INR Waived  Increase Coumadin to 15mg  T, Wed, Thu and keep 10mg  daily all other days He will follow-up in the next 2 weeks for  recheck   Orders Placed This Encounter  Procedures  . CoaguChek XS/INR Waived   No orders of the defined types were placed in this encounter.    [name], DO [facility] [phone]

## 2021-01-29 ENCOUNTER — Other Ambulatory Visit: Payer: Self-pay

## 2021-01-29 ENCOUNTER — Ambulatory Visit (INDEPENDENT_AMBULATORY_CARE_PROVIDER_SITE_OTHER): Payer: Self-pay | Admitting: Family Medicine

## 2021-01-29 ENCOUNTER — Encounter: Payer: Self-pay | Admitting: Family Medicine

## 2021-01-29 VITALS — BP 120/75 | HR 67 | Ht 71.0 in | Wt 225.0 lb

## 2021-01-29 DIAGNOSIS — L409 Psoriasis, unspecified: Secondary | ICD-10-CM

## 2021-01-29 DIAGNOSIS — Z7901 Long term (current) use of anticoagulants: Secondary | ICD-10-CM

## 2021-01-29 DIAGNOSIS — D6851 Activated protein C resistance: Secondary | ICD-10-CM

## 2021-01-29 LAB — COAGUCHEK XS/INR WAIVED
INR: 2.1 — ABNORMAL HIGH (ref 0.9–1.1)
Prothrombin Time: 25.3 s

## 2021-01-29 MED ORDER — WARFARIN SODIUM 10 MG PO TABS: 10.0000 mg | ORAL_TABLET | Freq: Every day | ORAL | 0 refills | Status: DC

## 2021-01-29 MED ORDER — WARFARIN SODIUM 5 MG PO TABS: ORAL_TABLET | ORAL | 0 refills | Status: DC

## 2021-01-29 MED ORDER — BETAMETHASONE VALERATE 0.1 % EX OINT: 1.0000 "application " | TOPICAL_OINTMENT | Freq: Two times a day (BID) | CUTANEOUS | 0 refills | Status: DC

## 2021-01-29 NOTE — Progress Notes (Signed)
Subjective: CC: INR check PCP: [name], DO QJF:[id number] GAETAN SPIEKER is a 40 y.o. male presenting to clinic today for:  1. INR check/ rash At last visit, patient's INR was slightly subtherapeutic at 1.8.  He had not had that days dose.  The patient was instructed to increase Coumadin to 15mg  T, Wed, Thu and keep 10mg  daily all other days.  He is here for 2-week follow-up.   No missed doses.  No reports of hematochezia, melena.  Lesions on buttocks got better with cream.  He is since discontinued.  Has persistent lesions on knee but has not continued using cream the on the 2-week treatment.   ROS: Per HPI  No Known Allergies Past Medical History:  Diagnosis Date  . Clotting disorder (HCC)   . Factor 5 Leiden mutation, heterozygous (HCC)   . Hypercholesterolemia   . Substance abuse (HCC)     Current Outpatient Medications:  .  betamethasone valerate ointment (VALISONE) 0.1 %, Apply 1 application topically 2 (two) times daily. x10-14 days on KNEE (Patient not taking: Reported on 01/09/2021), Disp: 45 g, Rfl: 0 .  clotrimazole-betamethasone (LOTRISONE) cream, Apply 1 application topically 2 (two) times daily. To buttock x7-10 days (Patient not taking: Reported on 01/09/2021), Disp: 15 g, Rfl: 0 .  warfarin (COUMADIN) 10 MG tablet, Take 1 tablet (10 mg total) by mouth daily., Disp: 90 tablet, Rfl: 0 .  warfarin (COUMADIN) 5 MG tablet, TAKE AS DIRECTED PER  CLINIC, Disp: 90 tablet, Rfl: 0 Social History   Socioeconomic History  . Marital status: Single    Spouse name: Not on file  . Number of children: Not on file  . Years of education: Not on file  . Highest education level: Not on file  Occupational History  . Not on file  Tobacco Use  . Smoking status: Current Every Day Smoker    Packs/day: 1.00    Years: 15.00    Pack years: 15.00  . Smokeless tobacco: Never Used  Vaping Use  . Vaping Use: Never used  Substance and Sexual Activity  . Alcohol use: Not Currently     Comment: See SA Chart  . Drug use: Not Currently    Types: Methamphetamines, Marijuana, Amphetamines    Comment: HX of   . Sexual activity: Yes  Other Topics Concern  . Not on file  Social History Narrative  . Not on file   Social Determinants of Health   Financial Resource Strain: Not on file  Food Insecurity: Not on file  Transportation Needs: Not on file  Physical Activity: Not on file  Stress: Not on file  Social Connections: Not on file  Intimate Partner Violence: Not on file   Family History  Problem Relation Age of Onset  . Hypertension Mother   . Hyperlipidemia Mother   . Hypertension Sister   . Alcohol abuse Father     Objective: Office vital signs reviewed. BP 120/75   Pulse 67   Ht 5\' 11"  (1.803 m)   Wt 225 lb (102.1 kg)   SpO2 95%   BMI 31.38 kg/m   Physical Examination:  General: Awake, alert, well nourished, No acute distress Cardio: Regular rate Skin: Persistent psoriatic plaque noted along the right anterior knee, less severe appearing than before  Assessment/ Plan: 40 y.o. male   Factor V Leiden, prothrombin gene mutation (HCC) - Plan: CoaguChek XS/INR Waived, warfarin (COUMADIN) 10 MG tablet, warfarin (COUMADIN) 5 MG tablet  Long term current use of anticoagulant  therapy - Plan: CoaguChek XS/INR Waived, warfarin (COUMADIN) 10 MG tablet, warfarin (COUMADIN) 5 MG tablet  Psoriasis - Plan: betamethasone valerate ointment (VALISONE) 0.1 %  INR is therapeutic.  Continue current regimen of 10 mg on Monday, Friday and Saturday with 15 mg Tuesdays, Wednesdays and Thursdays.  Refills have been sent.  Psoriatic lesion improving with corticosteroid.  No refills needed  No orders of the defined types were placed in this encounter.  No orders of the defined types were placed in this encounter.    [name], DO [facility] [phone]

## 2021-03-21 ENCOUNTER — Ambulatory Visit (INDEPENDENT_AMBULATORY_CARE_PROVIDER_SITE_OTHER): Payer: Self-pay | Admitting: Family Medicine

## 2021-03-21 ENCOUNTER — Encounter: Payer: Self-pay | Admitting: Family Medicine

## 2021-03-21 DIAGNOSIS — J069 Acute upper respiratory infection, unspecified: Secondary | ICD-10-CM

## 2021-03-21 NOTE — Progress Notes (Signed)
   Virtual Visit  Note Due to COVID-19 pandemic this visit was conducted virtually. This visit type was conducted due to national recommendations for restrictions regarding the COVID-19 Pandemic (e.g. social distancing, sheltering in place) in an effort to limit this patient's exposure and mitigate transmission in our community. All issues noted in this document were discussed and addressed.  A physical exam was not performed with this format.  I connected with Adam Landry on 03/21/21 at [phone] by phone and verified that I am speaking with the correct person using two identifiers. Adam Landry is currently located at home and no one is currently with him during the visit. The provider, Gabriel Earing, FNP is located in their office at time of visit.  I discussed the limitations, risks, security and privacy concerns of performing an evaluation and management service by phone and the availability of in person appointments. I also discussed with the patient that there may be a patient responsible charge related to this service. The patient expressed understanding and agreed to proceed.  CC: congestion  History and Present Illness:  HPI  Adam Landry reports head congestion, headache, sore throat, and mild dry cough x 3 days. He denies, fever, chills, body aches, nausea, or vomiting. Denies shortness of breath or chest pain. He has been taking tylenol sinus, vitamin C, and vitamin D for his symptoms with some improvement. He has had a negative home Covid test. He needs a note for work.    ROS  As per HPI.    Observations/Objective: Alert and oriented x 3. Able to speak in full sentences without difficulty.   Assessment and Plan: [name] was seen today for nasal congestion.  Diagnoses and all orders for this visit:  Viral URI with cough Negative Covid test. Continue symptomatic care at home. Declined Rx for tessalon perles. Stay well hydrated, rest. Work note provided.    Follow Up  Instructions: Return to office for new or worsening symptoms, or if symptoms persist.     I discussed the assessment and treatment plan with the patient. The patient was provided an opportunity to ask questions and all were answered. The patient agreed with the plan and demonstrated an understanding of the instructions.   The patient was advised to call back or seek an in-person evaluation if the symptoms worsen or if the condition fails to improve as anticipated.  The above assessment and management plan was discussed with the patient. The patient verbalized understanding of and has agreed to the management plan. Patient is aware to call the clinic if symptoms persist or worsen. Patient is aware when to return to the clinic for a follow-up visit. Patient educated on when it is appropriate to go to the emergency department.   Time call ended:  0828  I provided 11 minutes of  phone time during this encounter.  The patient indicates understanding of these issues and agrees with the plan.  Gabriel Earing, FNP

## 2021-03-29 ENCOUNTER — Encounter: Payer: Self-pay | Admitting: Family Medicine

## 2021-03-29 ENCOUNTER — Ambulatory Visit (INDEPENDENT_AMBULATORY_CARE_PROVIDER_SITE_OTHER): Payer: Self-pay | Admitting: Family Medicine

## 2021-03-29 ENCOUNTER — Other Ambulatory Visit: Payer: Self-pay

## 2021-03-29 VITALS — BP 127/75 | HR 67 | Temp 98.5°F | Resp 20 | Ht 71.0 in | Wt 227.0 lb

## 2021-03-29 DIAGNOSIS — D6851 Activated protein C resistance: Secondary | ICD-10-CM

## 2021-03-29 DIAGNOSIS — Z7901 Long term (current) use of anticoagulants: Secondary | ICD-10-CM

## 2021-03-29 LAB — COAGUCHEK XS/INR WAIVED
INR: 3.2 — ABNORMAL HIGH (ref 0.9–1.1)
Prothrombin Time: 38 s

## 2021-03-29 MED ORDER — WARFARIN SODIUM 5 MG PO TABS: ORAL_TABLET | ORAL | 0 refills | Status: DC

## 2021-03-29 MED ORDER — WARFARIN SODIUM 10 MG PO TABS: 10.0000 mg | ORAL_TABLET | Freq: Every day | ORAL | 0 refills | Status: DC

## 2021-03-29 NOTE — Progress Notes (Signed)
Subjective: CC: Factor V leiden mutation PCP: [name], DO Adam Landry is a 40 y.o. male presenting to clinic today for:  1.  Factor V Leyden mutation Goal INR 2-3 Last visit patient's INR was therapeutic.  He is instructed to continue 10 mg 3 days/week with 15 mg daily all other days.  He is here for follow-up.  He does not report any missed doses.  He is incorporating broccoli and salads into his diet.  No bleeding.   ROS: Per HPI  No Known Allergies Past Medical History:  Diagnosis Date  . Clotting disorder (HCC)   . Factor 5 Leiden mutation, heterozygous (HCC)   . Hypercholesterolemia   . Substance abuse (HCC)     Current Outpatient Medications:  .  betamethasone valerate ointment (VALISONE) 0.1 %, Apply 1 application topically 2 (two) times daily. x10-14 days on KNEE, Disp: 45 g, Rfl: 0 .  clotrimazole-betamethasone (LOTRISONE) cream, Apply 1 application topically 2 (two) times daily. To buttock x7-10 days, Disp: 15 g, Rfl: 0 .  warfarin (COUMADIN) 10 MG tablet, Take 1 tablet (10 mg total) by mouth daily., Disp: 90 tablet, Rfl: 0 .  warfarin (COUMADIN) 5 MG tablet, TAKE AS DIRECTED PER  CLINIC, Disp: 90 tablet, Rfl: 0 Social History   Socioeconomic History  . Marital status: Single    Spouse name: Not on file  . Number of children: Not on file  . Years of education: Not on file  . Highest education level: Not on file  Occupational History  . Not on file  Tobacco Use  . Smoking status: Current Every Day Smoker    Packs/day: 1.00    Years: 15.00    Pack years: 15.00  . Smokeless tobacco: Never Used  Vaping Use  . Vaping Use: Never used  Substance and Sexual Activity  . Alcohol use: Not Currently    Comment: See SA Chart  . Drug use: Not Currently    Types: Methamphetamines, Marijuana, Amphetamines    Comment: HX of   . Sexual activity: Yes  Other Topics Concern  . Not on file  Social History Narrative  . Not on file   Social  Determinants of Health   Financial Resource Strain: Not on file  Food Insecurity: Not on file  Transportation Needs: Not on file  Physical Activity: Not on file  Stress: Not on file  Social Connections: Not on file  Intimate Partner Violence: Not on file   Family History  Problem Relation Age of Onset  . Hypertension Mother   . Hyperlipidemia Mother   . Hypertension Sister   . Alcohol abuse Father     Objective: Office vital signs reviewed. BP 127/75   Pulse 67   Temp 98.5 F (36.9 C)   Resp 20   Ht 5\' 11"  (1.803 m)   Wt 227 lb (103 kg)   SpO2 96%   BMI 31.66 kg/m   Physical Examination:  General: Awake, alert, well nourished, No acute distress HEENT: Normal, sclera white, MMM Cardio: regular rate and rhythm, S1S2 heard, no murmurs appreciated Pulm: clear to auscultation bilaterally, no wheezes, rhonchi or rales; normal work of breathing on room air  Assessment/ Plan: 40 y.o. male   Factor V Leiden, prothrombin gene mutation (HCC) - Plan: CoaguChek XS/INR Waived, warfarin (COUMADIN) 10 MG tablet, warfarin (COUMADIN) 5 MG tablet  Long term current use of anticoagulant therapy - Plan: CoaguChek XS/INR Waived, warfarin (COUMADIN) 10 MG tablet, warfarin (COUMADIN) 5 MG tablet  INR is slightly supratherapeutic at 3.2 today.  Given that he is eating greens and some vitamin K rich foods and this is still elevated I have recommended that he reduce his dose to 15 mg 2 days/week with 10 mg daily all other days.  He may follow-up in 8 weeks, sooner if needed  No orders of the defined types were placed in this encounter.  No orders of the defined types were placed in this encounter.    [name], DO [facility] [phone]

## 2021-05-31 ENCOUNTER — Ambulatory Visit (INDEPENDENT_AMBULATORY_CARE_PROVIDER_SITE_OTHER): Payer: Self-pay | Admitting: Family Medicine

## 2021-05-31 ENCOUNTER — Other Ambulatory Visit: Payer: Self-pay

## 2021-05-31 ENCOUNTER — Encounter: Payer: Self-pay | Admitting: Family Medicine

## 2021-05-31 VITALS — BP 141/90 | HR 67 | Temp 97.7°F | Ht 71.0 in | Wt 223.0 lb

## 2021-05-31 DIAGNOSIS — Z7901 Long term (current) use of anticoagulants: Secondary | ICD-10-CM

## 2021-05-31 DIAGNOSIS — D6851 Activated protein C resistance: Secondary | ICD-10-CM

## 2021-05-31 LAB — COAGUCHEK XS/INR WAIVED
INR: 2.5 — ABNORMAL HIGH (ref 0.9–1.1)
Prothrombin Time: 29.9 s

## 2021-05-31 MED ORDER — WARFARIN SODIUM 10 MG PO TABS: 10.0000 mg | ORAL_TABLET | Freq: Every day | ORAL | 0 refills | Status: DC

## 2021-05-31 MED ORDER — WARFARIN SODIUM 5 MG PO TABS: ORAL_TABLET | ORAL | 0 refills | Status: DC

## 2021-05-31 NOTE — Progress Notes (Signed)
   Subjective: CC: INR check PCP: [name], DO JJK:KXFGHWE Adam Landry is a 40 y.o. male presenting to clinic today for:  1. INR check for factor V Leyden mutation Patient reports compliance with Coumadin.  Denies any bleeding or other concerning features.  No missed doses.  Needs refills   ROS: Per HPI  No Known Allergies Past Medical History:  Diagnosis Date   Clotting disorder (HCC)    Factor 5 Leiden mutation, heterozygous (HCC)    Hypercholesterolemia    Substance abuse (HCC)     Current Outpatient Medications:    betamethasone valerate ointment (VALISONE) 0.1 %, Apply 1 application topically 2 (two) times daily. x10-14 days on KNEE, Disp: 45 g, Rfl: 0   clotrimazole-betamethasone (LOTRISONE) cream, Apply 1 application topically 2 (two) times daily. To buttock x7-10 days, Disp: 15 g, Rfl: 0   warfarin (COUMADIN) 10 MG tablet, Take 1 tablet (10 mg total) by mouth daily., Disp: 90 tablet, Rfl: 0   warfarin (COUMADIN) 5 MG tablet, TAKE AS DIRECTED PER  CLINIC, Disp: 90 tablet, Rfl: 0 Social History   Socioeconomic History   Marital status: Single    Spouse name: Not on file   Number of children: Not on file   Years of education: Not on file   Highest education level: Not on file  Occupational History   Not on file  Tobacco Use   Smoking status: Every Day    Packs/day: 1.00    Years: 15.00    Pack years: 15.00    Types: Cigarettes   Smokeless tobacco: Never  Vaping Use   Vaping Use: Never used  Substance and Sexual Activity   Alcohol use: Not Currently    Comment: See SA Chart   Drug use: Not Currently    Types: Methamphetamines, Marijuana, Amphetamines    Comment: HX of    Sexual activity: Yes  Other Topics Concern   Not on file  Social History Narrative   Not on file   Social Determinants of Health   Financial Resource Strain: Not on file  Food Insecurity: Not on file  Transportation Needs: Not on file  Physical Activity: Not on file   Stress: Not on file  Social Connections: Not on file  Intimate Partner Violence: Not on file   Family History  Problem Relation Age of Onset   Hypertension Mother    Hyperlipidemia Mother    Hypertension Sister    Alcohol abuse Father     Objective: Office vital signs reviewed. BP (!) 141/90   Pulse 67   Temp 97.7 F (36.5 C) (Temporal)   Ht 5\' 11"  (1.803 m)   Wt 223 lb (101.2 kg)   SpO2 94%   BMI 31.10 kg/m   Physical Examination:  General: Awake, alert, well nourished, well appearing. No acute distress   Assessment/ Plan: 40 y.o. male   Factor V Leiden, prothrombin gene mutation (HCC)  Long term current use of anticoagulant therapy - Plan: CoaguChek XS/INR Waived  INR therapeutic at 2.5 today.  No changes made.  Refill sent to pharmacy.  May follow-up in 8 weeks, sooner if needed  Orders Placed This Encounter  Procedures   CoaguChek XS/INR Waived   No orders of the defined types were placed in this encounter.    40, DO [facility] [phone]

## 2021-07-31 ENCOUNTER — Ambulatory Visit: Payer: Self-pay | Admitting: Family Medicine

## 2021-08-02 ENCOUNTER — Encounter: Payer: Self-pay | Admitting: Family Medicine

## 2021-08-02 ENCOUNTER — Other Ambulatory Visit: Payer: Self-pay

## 2021-08-02 ENCOUNTER — Ambulatory Visit: Payer: Self-pay | Admitting: Family Medicine

## 2021-08-02 VITALS — BP 127/79 | HR 58 | Temp 97.5°F | Ht 71.0 in | Wt 221.2 lb

## 2021-08-02 DIAGNOSIS — Z566 Other physical and mental strain related to work: Secondary | ICD-10-CM

## 2021-08-02 DIAGNOSIS — Z7901 Long term (current) use of anticoagulants: Secondary | ICD-10-CM

## 2021-08-02 DIAGNOSIS — D6851 Activated protein C resistance: Secondary | ICD-10-CM

## 2021-08-02 LAB — COAGUCHEK XS/INR WAIVED
INR: 3.1 — ABNORMAL HIGH (ref 0.9–1.1)
Prothrombin Time: 36.9 s

## 2021-08-02 MED ORDER — WARFARIN SODIUM 5 MG PO TABS: ORAL_TABLET | ORAL | 0 refills | Status: DC

## 2021-08-02 MED ORDER — WARFARIN SODIUM 10 MG PO TABS: 10.0000 mg | ORAL_TABLET | Freq: Every day | ORAL | 0 refills | Status: DC

## 2021-08-02 NOTE — Progress Notes (Signed)
Subjective: CC: Adam Landry PCP: [name], DO Adam Landry is a 40 y.o. male presenting to clinic today for:  1.  Adam V Leyden mutation Patient is compliant with Coumadin.  No bleeding episodes or concerns.  2.  Stress at work Having some stress at work, specifically with the nature of his work.  He is on 40+ foot heights which she is not comfortable with.  He does use safety equipment but worries sometimes that he is quite happy adequate.  He is trying to work through this.   ROS: Per HPI  No Known Allergies Past Medical History:  Diagnosis Date   Clotting disorder (HCC)    Adam 5 Leiden mutation, heterozygous (HCC)    Hypercholesterolemia    Substance abuse (HCC)     Current Outpatient Medications:    warfarin (COUMADIN) 10 MG tablet, Take 1 tablet (10 mg total) by mouth daily., Disp: 90 tablet, Rfl: 0   warfarin (COUMADIN) 5 MG tablet, TAKE AS DIRECTED PER  CLINIC, Disp: 90 tablet, Rfl: 0   betamethasone valerate ointment (VALISONE) 0.1 %, Apply 1 application topically 2 (two) times daily. x10-14 days on KNEE (Patient not taking: Reported on 08/02/2021), Disp: 45 g, Rfl: 0   clotrimazole-betamethasone (LOTRISONE) cream, Apply 1 application topically 2 (two) times daily. To buttock x7-10 days (Patient not taking: Reported on 08/02/2021), Disp: 15 g, Rfl: 0 Social History   Socioeconomic History   Marital status: Single    Spouse name: Not on file   Number of children: Not on file   Years of education: Not on file   Highest education level: Not on file  Occupational History   Not on file  Tobacco Use   Smoking status: Every Day    Packs/day: 1.00    Years: 15.00    Pack years: 15.00    Types: Cigarettes   Smokeless tobacco: Never  Vaping Use   Vaping Use: Never used  Substance and Sexual Activity   Alcohol use: Not Currently    Comment: See SA Chart   Drug use: Not Currently    Types: Methamphetamines, Marijuana, Amphetamines     Comment: HX of    Sexual activity: Yes  Other Topics Concern   Not on file  Social History Narrative   Not on file   Social Determinants of Health   Financial Resource Strain: Not on file  Food Insecurity: Not on file  Transportation Needs: Not on file  Physical Activity: Not on file  Stress: Not on file  Social Connections: Not on file  Intimate Partner Violence: Not on file   Family History  Problem Relation Age of Onset   Hypertension Mother    Hyperlipidemia Mother    Hypertension Sister    Alcohol abuse Father     Objective: Office vital signs reviewed. BP 127/79   Pulse (!) 58   Temp (!) 97.5 F (36.4 C)   Ht 5\' 11"  (1.803 m)   Wt 221 lb 3.2 oz (100.3 kg)   SpO2 95%   BMI 30.85 kg/m   Physical Examination:  General: Awake, alert, well nourished, No acute distress Cardio: regular rate and rhythm, S1S2 heard, no murmurs appreciated Pulm: clear to auscultation bilaterally, no wheezes, rhonchi or rales; normal work of breathing on room air  Depression screen [facility] 2/9 08/02/2021 03/29/2021 01/29/2021  Decreased Interest 0 0 0  Down, Depressed, Hopeless 0 0 0  PHQ - 2 Score 0 0 0  Altered sleeping 0 - -  Tired, decreased energy 0 - -  Change in appetite 2 - -  Feeling bad or failure about yourself  0 - -  Trouble concentrating 0 - -  Moving slowly or fidgety/restless 0 - -  Suicidal thoughts 0 - -  PHQ-9 Score 2 - -  Difficult doing work/chores Not difficult at all - -  Some recent data might be hidden   GAD 7 : Generalized Anxiety Score 08/02/2021 11/08/2018  Nervous, Anxious, on Edge 3 3  Control/stop worrying 1 2  Worry too much - different things 0 1  Trouble relaxing 0 3  Restless 0 3  Easily annoyed or irritable 1 3  Afraid - awful might happen 2 3  Total GAD 7 Score 7 18  Anxiety Difficulty Somewhat difficult Extremely difficult  Some encounter information is confidential and restricted. Go to Review Flowsheets activity to see all data.    Assessment/ Plan: 40 y.o. male   Adam V Leiden, prothrombin gene mutation (HCC) - Plan: CoaguChek XS/INR Waived, warfarin (COUMADIN) 10 MG tablet, warfarin (COUMADIN) 5 MG tablet  Long term current use of anticoagulant therapy - Plan: warfarin (COUMADIN) 10 MG tablet, warfarin (COUMADIN) 5 MG tablet  Stress at work  Fairly slightly supratherapeutic at 3.1 today.  No changes made to his regimen.  He is not having any concerning symptoms or signs.  May follow-up at normal 6 to 8-week interval  Having some stress at work related to the nature of his work he has to be at very high heights.  Stressed situational.  We will continue to monitor him  Orders Placed This Encounter  Procedures   CoaguChek XS/INR Waived   No orders of the defined types were placed in this encounter.    [name], DO [facility] [phone]

## 2021-10-02 ENCOUNTER — Encounter: Payer: Self-pay | Admitting: Family Medicine

## 2021-10-02 ENCOUNTER — Ambulatory Visit (INDEPENDENT_AMBULATORY_CARE_PROVIDER_SITE_OTHER): Payer: Self-pay | Admitting: Family Medicine

## 2021-10-02 VITALS — BP 119/77 | HR 78 | Temp 98.1°F | Resp 20 | Ht 71.0 in | Wt 220.0 lb

## 2021-10-02 DIAGNOSIS — D6851 Activated protein C resistance: Secondary | ICD-10-CM

## 2021-10-02 DIAGNOSIS — Z7901 Long term (current) use of anticoagulants: Secondary | ICD-10-CM

## 2021-10-02 LAB — COAGUCHEK XS/INR WAIVED
INR: 2.8 — ABNORMAL HIGH (ref 0.9–1.1)
Prothrombin Time: 33.9 s

## 2021-10-02 MED ORDER — WARFARIN SODIUM 5 MG PO TABS: ORAL_TABLET | ORAL | 0 refills | Status: DC

## 2021-10-02 MED ORDER — WARFARIN SODIUM 10 MG PO TABS: 10.0000 mg | ORAL_TABLET | Freq: Every day | ORAL | 0 refills | Status: DC

## 2021-10-02 NOTE — Progress Notes (Signed)
Subjective: CC: INR check PCP: [name], DO KWI:[id number] NOVA EVETT is a 40 y.o. male presenting to clinic today for:  1.  Chronic anticoagulation for factor V Leyden mutation Patient is compliant with Coumadin.  No bleeding episodes.  No concerns or complaints today   ROS: Per HPI  No Known Allergies Past Medical History:  Diagnosis Date   Clotting disorder (HCC)    Factor 5 Leiden mutation, heterozygous (HCC)    Hypercholesterolemia    Substance abuse (HCC)     Current Outpatient Medications:    betamethasone valerate ointment (VALISONE) 0.1 %, Apply 1 application topically 2 (two) times daily. x10-14 days on KNEE (Patient not taking: Reported on 08/02/2021), Disp: 45 g, Rfl: 0   clotrimazole-betamethasone (LOTRISONE) cream, Apply 1 application topically 2 (two) times daily. To buttock x7-10 days (Patient not taking: Reported on 08/02/2021), Disp: 15 g, Rfl: 0   warfarin (COUMADIN) 10 MG tablet, Take 1 tablet (10 mg total) by mouth daily., Disp: 90 tablet, Rfl: 0   warfarin (COUMADIN) 5 MG tablet, TAKE AS DIRECTED PER  CLINIC, Disp: 90 tablet, Rfl: 0 Social History   Socioeconomic History   Marital status: Single    Spouse name: Not on file   Number of children: Not on file   Years of education: Not on file   Highest education level: Not on file  Occupational History   Not on file  Tobacco Use   Smoking status: Every Day    Packs/day: 1.00    Years: 15.00    Pack years: 15.00    Types: Cigarettes   Smokeless tobacco: Never  Vaping Use   Vaping Use: Never used  Substance and Sexual Activity   Alcohol use: Not Currently    Comment: See SA Chart   Drug use: Not Currently    Types: Methamphetamines, Marijuana, Amphetamines    Comment: HX of    Sexual activity: Yes  Other Topics Concern   Not on file  Social History Narrative   Not on file   Social Determinants of Health   Financial Resource Strain: Not on file  Food Insecurity: Not on file   Transportation Needs: Not on file  Physical Activity: Not on file  Stress: Not on file  Social Connections: Not on file  Intimate Partner Violence: Not on file   Family History  Problem Relation Age of Onset   Hypertension Mother    Hyperlipidemia Mother    Hypertension Sister    Alcohol abuse Father     Objective: Office vital signs reviewed. BP 119/77   Pulse 78   Temp 98.1 F (36.7 C) (Temporal)   Resp 20   Ht 5\' 11"  (1.803 m)   Wt 220 lb (99.8 kg)   SpO2 95%   BMI 30.68 kg/m   Physical Examination:  General: Awake, alert, well nourished, No acute distress Cardio: regular rate and rhythm, S1S2 heard, no murmurs appreciated Pulm: clear to auscultation bilaterally, no wheezes, rhonchi or rales; normal work of breathing on room air  Assessment/ Plan: 40 y.o. male   Factor V Leiden, prothrombin gene mutation (HCC) - Plan: CoaguChek XS/INR Waived, warfarin (COUMADIN) 10 MG tablet, warfarin (COUMADIN) 5 MG tablet  Long term current use of anticoagulant therapy - Plan: CoaguChek XS/INR Waived, warfarin (COUMADIN) 10 MG tablet, warfarin (COUMADIN) 5 MG tablet  INR therapeutic.  No changes.  Refill sent.  Follow-up in 2 months  Orders Placed This Encounter  Procedures   CoaguChek XS/INR Waived  Meds ordered this encounter  Medications   warfarin (COUMADIN) 10 MG tablet    Sig: Take 1 tablet (10 mg total) by mouth daily.    Dispense:  90 tablet    Refill:  0   warfarin (COUMADIN) 5 MG tablet    Sig: TAKE AS DIRECTED PER  CLINIC    Dispense:  90 tablet    Refill:  0     Jeriah Skufca Hulen Skains, DO [facility] [phone]

## 2021-12-02 ENCOUNTER — Encounter: Payer: Self-pay | Admitting: Family Medicine

## 2021-12-02 ENCOUNTER — Ambulatory Visit (INDEPENDENT_AMBULATORY_CARE_PROVIDER_SITE_OTHER): Payer: Self-pay | Admitting: Family Medicine

## 2021-12-02 ENCOUNTER — Telehealth: Payer: Self-pay | Admitting: Family Medicine

## 2021-12-02 VITALS — BP 111/84 | HR 69 | Temp 97.3°F | Ht 71.0 in | Wt 221.4 lb

## 2021-12-02 DIAGNOSIS — R791 Abnormal coagulation profile: Secondary | ICD-10-CM

## 2021-12-03 LAB — COAGUCHEK XS/INR WAIVED
INR: 4 — ABNORMAL HIGH (ref 0.9–1.1)
Prothrombin Time: 48.5 s

## 2021-12-04 NOTE — Progress Notes (Signed)
Subjective: CC: INR check PCP: [name], DO JJO:ACZYSAY DENCIL Adam Landry is a 40 y.o. male presenting to clinic today for:  1.  Factor V Leyden gene mutation Patient is compliant with his 10 mg alternating with 15 mg of Coumadin.  No bleeding.  No change in diet.  No recent medication changes.   ROS: Per HPI  No Known Allergies Past Medical History:  Diagnosis Date   Clotting disorder (HCC)    Factor 5 Leiden mutation, heterozygous (HCC)    Hypercholesterolemia    Substance abuse (HCC)     Current Outpatient Medications:    warfarin (COUMADIN) 10 MG tablet, Take 1 tablet (10 mg total) by mouth daily., Disp: 90 tablet, Rfl: 0   warfarin (COUMADIN) 5 MG tablet, TAKE AS DIRECTED PER  CLINIC, Disp: 90 tablet, Rfl: 0   betamethasone valerate ointment (VALISONE) 0.1 %, Apply 1 application topically 2 (two) times daily. x10-14 days on KNEE (Patient not taking: Reported on 10/02/2021), Disp: 45 g, Rfl: 0   clotrimazole-betamethasone (LOTRISONE) cream, Apply 1 application topically 2 (two) times daily. To buttock x7-10 days (Patient not taking: Reported on 10/02/2021), Disp: 15 g, Rfl: 0 Social History   Socioeconomic History   Marital status: Single    Spouse name: Not on file   Number of children: Not on file   Years of education: Not on file   Highest education level: Not on file  Occupational History   Not on file  Tobacco Use   Smoking status: Every Day    Packs/day: 1.00    Years: 15.00    Pack years: 15.00    Types: Cigarettes   Smokeless tobacco: Never  Vaping Use   Vaping Use: Never used  Substance and Sexual Activity   Alcohol use: Not Currently    Comment: See SA Chart   Drug use: Not Currently    Types: Methamphetamines, Marijuana, Amphetamines    Comment: HX of    Sexual activity: Yes  Other Topics Concern   Not on file  Social History Narrative   Not on file   Social Determinants of Health   Financial Resource Strain: Not on file  Food  Insecurity: Not on file  Transportation Needs: Not on file  Physical Activity: Not on file  Stress: Not on file  Social Connections: Not on file  Intimate Partner Violence: Not on file   Family History  Problem Relation Age of Onset   Hypertension Mother    Hyperlipidemia Mother    Hypertension Sister    Alcohol abuse Father     Objective: Office vital signs reviewed. BP 111/84    Pulse 69    Temp (!) 97.3 F (36.3 C)    Ht 5\' 11"  (1.803 m)    Wt 221 lb 6.4 oz (100.4 kg)    SpO2 96%    BMI 30.88 kg/m   Physical Examination:  General: Awake, alert, well nourished, No acute distress HEENT: Sclera white Cardio: regular rate  Pulm: Normal work of breathing on room air  Assessment/ Plan: 40 y.o. male   Subtherapeutic international normalized ratio (INR) - Plan: CoaguChek XS/INR Waived  INR supratherapeutic today.  He will skip tomorrow's 15 mg dose and resume normal dosing.  Recheck in 1 week  Orders Placed This Encounter  Procedures   CoaguChek XS/INR Waived   No orders of the defined types were placed in this encounter.    40, DO [facility] [phone]

## 2021-12-30 ENCOUNTER — Ambulatory Visit (INDEPENDENT_AMBULATORY_CARE_PROVIDER_SITE_OTHER): Payer: 59 | Admitting: Family Medicine

## 2021-12-30 ENCOUNTER — Encounter: Payer: Self-pay | Admitting: Family Medicine

## 2021-12-30 VITALS — BP 114/78 | HR 65 | Temp 97.8°F | Ht 71.0 in | Wt 215.8 lb

## 2021-12-30 DIAGNOSIS — Z7901 Long term (current) use of anticoagulants: Secondary | ICD-10-CM | POA: Diagnosis not present

## 2021-12-30 DIAGNOSIS — D6851 Activated protein C resistance: Secondary | ICD-10-CM

## 2021-12-30 LAB — COAGUCHEK XS/INR WAIVED
INR: 1.9 — ABNORMAL HIGH (ref 0.9–1.1)
Prothrombin Time: 22.3 s

## 2021-12-30 NOTE — Progress Notes (Signed)
Subjective: CC:INR check PCP: [name], DO MWU:XLKGMWN Adam Landry is a 41 y.o. male presenting to clinic today for:  1. Chronic anticoagulation for factor V Leyden disorder Patient level noted to be supratherapeutic at 4.0 last visit.  He was to skip 15 mg in the resume 15 alternating with 10 mg dosing schedule.  He presents today and notes that he has been compliant with 15 mg alternating with 10 mg but feels like maybe he missed a dose somewhere in there recently.  No bleeding episodes reported   ROS: Per HPI  No Known Allergies Past Medical History:  Diagnosis Date   Clotting disorder (HCC)    Factor 5 Leiden mutation, heterozygous (HCC)    Hypercholesterolemia    Substance abuse (HCC)     Current Outpatient Medications:    betamethasone valerate ointment (VALISONE) 0.1 %, Apply 1 application topically 2 (two) times daily. x10-14 days on KNEE (Patient not taking: Reported on 10/02/2021), Disp: 45 g, Rfl: 0   clotrimazole-betamethasone (LOTRISONE) cream, Apply 1 application topically 2 (two) times daily. To buttock x7-10 days (Patient not taking: Reported on 10/02/2021), Disp: 15 g, Rfl: 0   warfarin (COUMADIN) 10 MG tablet, Take 1 tablet (10 mg total) by mouth daily., Disp: 90 tablet, Rfl: 0   warfarin (COUMADIN) 5 MG tablet, TAKE AS DIRECTED PER  CLINIC, Disp: 90 tablet, Rfl: 0 Social History   Socioeconomic History   Marital status: Single    Spouse name: Not on file   Number of children: Not on file   Years of education: Not on file   Highest education level: Not on file  Occupational History   Not on file  Tobacco Use   Smoking status: Every Day    Packs/day: 1.00    Years: 15.00    Pack years: 15.00    Types: Cigarettes   Smokeless tobacco: Never  Vaping Use   Vaping Use: Never used  Substance and Sexual Activity   Alcohol use: Not Currently    Comment: See SA Chart   Drug use: Not Currently    Types: Methamphetamines, Marijuana, Amphetamines     Comment: HX of    Sexual activity: Yes  Other Topics Concern   Not on file  Social History Narrative   Not on file   Social Determinants of Health   Financial Resource Strain: Not on file  Food Insecurity: Not on file  Transportation Needs: Not on file  Physical Activity: Not on file  Stress: Not on file  Social Connections: Not on file  Intimate Partner Violence: Not on file   Family History  Problem Relation Age of Onset   Hypertension Mother    Hyperlipidemia Mother    Hypertension Sister    Alcohol abuse Father     Objective: Office vital signs reviewed. BP 114/78    Pulse 65    Temp 97.8 F (36.6 C)    Ht 5\' 11"  (1.803 m)    Wt 215 lb 12.8 oz (97.9 kg)    SpO2 96%    BMI 30.10 kg/m   Physical Examination:  General: Awake, alert, well appearing male, No acute distress Cardio:RRR  Assessment/ Plan: 41 y.o. male  Factor V Leiden, prothrombin gene mutation (HCC) - Plan: CoaguChek XS/INR Waived  Long term current use of anticoagulant therapy - Plan: CoaguChek XS/INR Waived  INR is slightly subtherapeutic at 1.9 today.  He will resume normal dosing of 15 mg alternating with 10 mg.  I would like  him to take care not to miss any doses.  We will reassess again in 6 weeks, sooner if concerns arise.  No orders of the defined types were placed in this encounter.  No orders of the defined types were placed in this encounter.    [name], DO [facility] [phone]

## 2022-01-09 ENCOUNTER — Telehealth: Payer: Self-pay | Admitting: Family Medicine

## 2022-01-09 NOTE — Telephone Encounter (Signed)
Pt called to let Dr Nadine Counts know that he was prescribed Malarone 250/100mg  daily that he is supposed to start taking Feb 16th for 22 days. Says he was told that this medicine has a drug interaction with his blood thinner and needs to know what Dr Nadine Counts wants him to do.

## 2022-01-10 NOTE — Telephone Encounter (Signed)
Please make sure he has an INR check scheduled 1 week after initiation of the medication.

## 2022-01-10 NOTE — Telephone Encounter (Signed)
PT AWARE HE NEEDS TO KEEP APPOINTMENT SCHEDULED

## 2022-02-04 ENCOUNTER — Telehealth: Payer: 59 | Admitting: Family Medicine

## 2022-02-05 ENCOUNTER — Ambulatory Visit (INDEPENDENT_AMBULATORY_CARE_PROVIDER_SITE_OTHER): Payer: 59 | Admitting: Nurse Practitioner

## 2022-02-05 ENCOUNTER — Encounter: Payer: Self-pay | Admitting: Nurse Practitioner

## 2022-02-05 DIAGNOSIS — Z01812 Encounter for preprocedural laboratory examination: Secondary | ICD-10-CM | POA: Diagnosis not present

## 2022-02-05 DIAGNOSIS — Z20822 Contact with and (suspected) exposure to covid-19: Secondary | ICD-10-CM | POA: Diagnosis not present

## 2022-02-05 NOTE — Progress Notes (Signed)
° °  Virtual Visit  Note Due to COVID-19 pandemic this visit was conducted virtually. This visit type was conducted due to national recommendations for restrictions regarding the COVID-19 Pandemic (e.g. social distancing, sheltering in place) in an effort to limit this patient's exposure and mitigate transmission in our community. All issues noted in this document were discussed and addressed.  A physical exam was not performed with this format.  I connected with Adam Landry on 02/05/22 at 8:10 AM by telephone and verified that I am speaking with the correct person using two identifiers. Adam Landry is currently located in the car during visit. The provider, Daryll Drown, NP is located in their office at time of visit.  I discussed the limitations, risks, security and privacy concerns of performing an evaluation and management service by telephone and the availability of in person appointments. I also discussed with the patient that there may be a patient responsible charge related to this service. The patient expressed understanding and agreed to proceed.   History and Present Illness:  HPI   Patient presents for COVID-19 swab for travel purposes.  No signs or symptoms of illness present.   Review of Systems  Constitutional: Negative.  Negative for chills, fever and malaise/fatigue.  HENT: Negative.    Eyes: Negative.   Respiratory: Negative.    Cardiovascular: Negative.   Skin:  Negative for rash.  All other systems reviewed and are negative.   Observations/Objective: Televisit patient not in distress  Assessment and Plan: COVID -19 required for travel.  Patient has no active sign of COVID.  Completed COVID-19 swab results pending.  Follow Up Instructions: Follow-up as needed    I discussed the assessment and treatment plan with the patient. The patient was provided an opportunity to ask questions and all were answered. The patient agreed with the plan and  demonstrated an understanding of the instructions.   The patient was advised to call back or seek an in-person evaluation if the symptoms worsen or if the condition fails to improve as anticipated.  The above assessment and management plan was discussed with the patient. The patient verbalized understanding of and has agreed to the management plan. Patient is aware to call the clinic if symptoms persist or worsen. Patient is aware when to return to the clinic for a follow-up visit. Patient educated on when it is appropriate to go to the emergency department.   Time call ended:  8:16 am   I provided 6 minutes of  non face-to-face time during this encounter.    Daryll Drown, NP

## 2022-02-06 LAB — NOVEL CORONAVIRUS, NAA: SARS-CoV-2, NAA: NOT DETECTED

## 2022-02-20 ENCOUNTER — Ambulatory Visit (INDEPENDENT_AMBULATORY_CARE_PROVIDER_SITE_OTHER): Payer: 59 | Admitting: Family Medicine

## 2022-02-20 ENCOUNTER — Encounter: Payer: Self-pay | Admitting: Family Medicine

## 2022-02-20 VITALS — BP 125/78 | HR 73 | Temp 97.4°F | Ht 71.0 in | Wt 214.0 lb

## 2022-02-20 DIAGNOSIS — R791 Abnormal coagulation profile: Secondary | ICD-10-CM | POA: Diagnosis not present

## 2022-02-20 DIAGNOSIS — D6851 Activated protein C resistance: Secondary | ICD-10-CM

## 2022-02-20 DIAGNOSIS — Z7901 Long term (current) use of anticoagulants: Secondary | ICD-10-CM

## 2022-02-20 LAB — POCT INR: INR: 3.3 — AB (ref 2–3)

## 2022-02-20 LAB — COAGUCHEK XS/INR WAIVED
INR: 3.3 — ABNORMAL HIGH (ref 0.9–1.1)
Prothrombin Time: 39.2 s

## 2022-02-20 NOTE — Progress Notes (Signed)
? ?Subjective: ?CC: INR check ?PCP: [name], DO ?XIP:Adam Landry is a 41 y.o. male presenting to clinic today for: ? ?1.  Factor V Leyden mutation ?Patient is compliant with his Coumadin.  He just got back from [place] and is currently being treated with Malarone.  He just started yesterday.  He does not report any unusual bleeding.  Does not report any diarrheal symptoms or other concerning infectious symptoms ? ? ?ROS: Per HPI ? ?No Known Allergies ?Past Medical History:  ?Diagnosis Date  ? Clotting disorder (HCC)   ? Factor 5 Leiden mutation, heterozygous (HCC)   ? Hypercholesterolemia   ? Substance abuse (HCC)   ? ? ?Current Outpatient Medications:  ?  betamethasone valerate ointment (VALISONE) 0.1 %, Apply 1 application topically 2 (two) times daily. x10-14 days on KNEE (Patient not taking: Reported on 12/30/2021), Disp: 45 g, Rfl: 0 ?  clotrimazole-betamethasone (LOTRISONE) cream, Apply 1 application topically 2 (two) times daily. To buttock x7-10 days (Patient not taking: Reported on 12/30/2021), Disp: 15 g, Rfl: 0 ?  warfarin (COUMADIN) 10 MG tablet, Take 1 tablet (10 mg total) by mouth daily., Disp: 90 tablet, Rfl: 0 ?  warfarin (COUMADIN) 5 MG tablet, TAKE AS DIRECTED PER  CLINIC, Disp: 90 tablet, Rfl: 0 ?Social History  ? ?Socioeconomic History  ? Marital status: Single  ?  Spouse name: Not on file  ? Number of children: Not on file  ? Years of education: Not on file  ? Highest education level: Not on file  ?Occupational History  ? Not on file  ?Tobacco Use  ? Smoking status: Every Day  ?  Packs/day: 1.00  ?  Years: 15.00  ?  Pack years: 15.00  ?  Types: Cigarettes  ? Smokeless tobacco: Never  ?Vaping Use  ? Vaping Use: Never used  ?Substance and Sexual Activity  ? Alcohol use: Not Currently  ?  Comment: See SA Chart  ? Drug use: Not Currently  ?  Types: Methamphetamines, Marijuana, Amphetamines  ?  Comment: HX of   ? Sexual activity: Yes  ?Other Topics Concern  ? Not on file  ?Social  History Narrative  ? Not on file  ? ?Social Determinants of Health  ? ?Financial Resource Strain: Not on file  ?Food Insecurity: Not on file  ?Transportation Needs: Not on file  ?Physical Activity: Not on file  ?Stress: Not on file  ?Social Connections: Not on file  ?Intimate Partner Violence: Not on file  ? ?Family History  ?Problem Relation Age of Onset  ? Hypertension Mother   ? Hyperlipidemia Mother   ? Hypertension Sister   ? Alcohol abuse Father   ? ? ?Objective: ?Office vital signs reviewed. ?BP 125/78   Pulse 73   Temp (!) 97.4 ?F (36.3 ?C)   Ht 5\' 11"  (1.803 m)   Wt 214 lb (97.1 kg)   SpO2 95%   BMI 29.85 kg/m?  ? ?Physical Examination:  ?General: Awake, alert, well nourished, No acute distress ?Pulm: Normal work of breathing on room air ? ?Assessment/ Plan: ?41 y.o. male  ? ?Subtherapeutic international normalized ratio (INR) - Plan: CoaguChek XS/INR Waived ? ?Factor V Leiden, prothrombin gene mutation (HCC) - Plan: CoaguChek XS/INR Waived ? ?Long term current use of anticoagulant therapy - Plan: CoaguChek XS/INR Waived ? ?INR supratherapeutic at 3.3.  Given the use of Malarone I would like him to skip one of his 10 mg doses as he is going to be thinner on the  Malarone.  Plan to follow-up as directed. ? ?No orders of the defined types were placed in this encounter. ? ?No orders of the defined types were placed in this encounter. ? ? ? ?[name], DO ?[facility] ?[phone] ? ?

## 2022-02-28 IMAGING — DX DG ELBOW 2V*R*
2 series · 2 of 2 positions shown · non-contrast
Comparison: None.

CLINICAL DATA: Right elbow pain, no known injury

EXAM:
RIGHT ELBOW - 2 VIEW

[elbow ap]
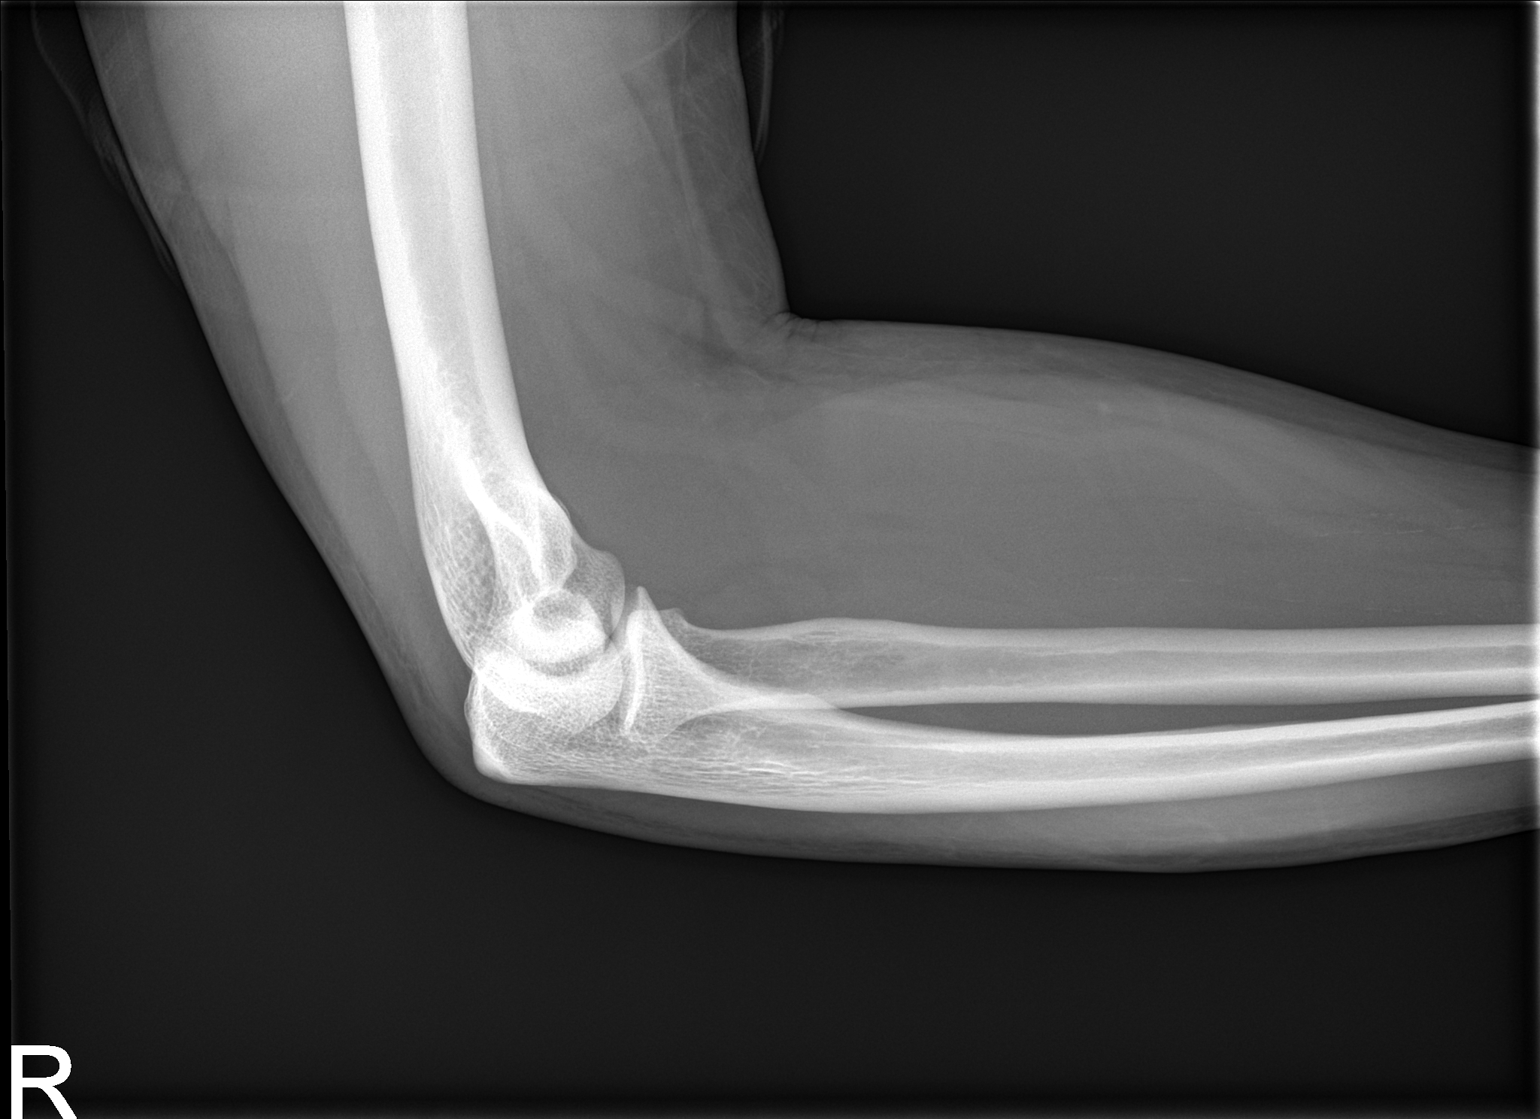

[elbow lat]
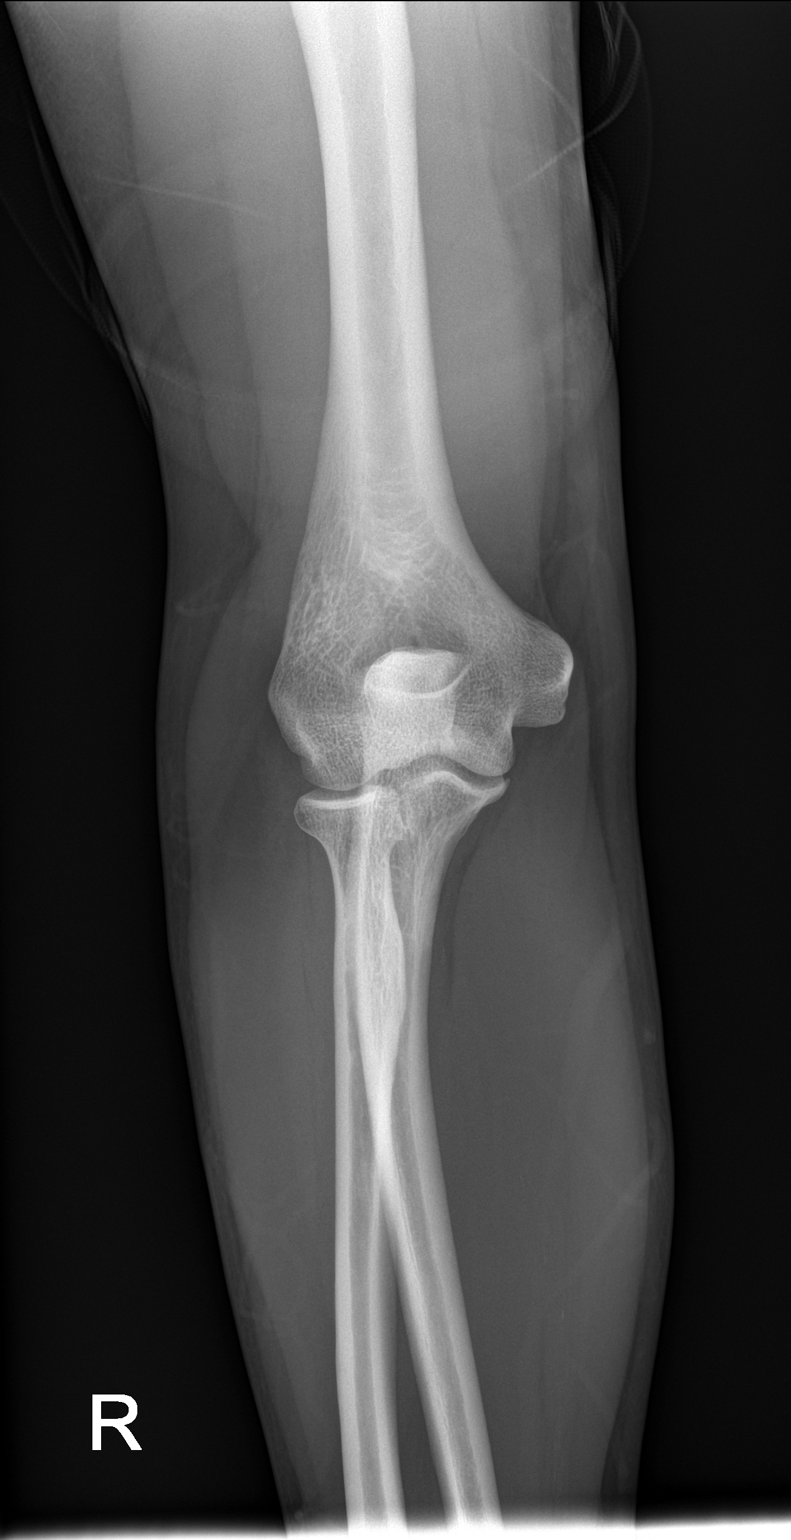

[2 of 2 positions shown; findings below may reference images not displayed]

FINDINGS: There is no evidence of fracture, dislocation, or joint effusion.
There is no evidence of arthropathy or other focal bone abnormality.
Soft tissues are unremarkable.
IMPRESSION: No fracture or dislocation of the right elbow. Joint spaces are
preserved. No elbow joint effusion.

## 2022-04-16 IMAGING — US US EXTREM LOW*L* LIMITED
1 series · 14 of 17 positions shown · non-contrast
Comparison: Radiograph 10/25/2019

CLINICAL DATA: Calf pain

EXAM:
ULTRASOUND left LOWER EXTREMITY LIMITED
TECHNIQUE: Ultrasound examination of the lower extremity soft tissues was
performed in the area of clinical concern.

[Series 1: us extrem low*left* limited · 17 acquisitions, 14 frames shown]
[im 1/17]
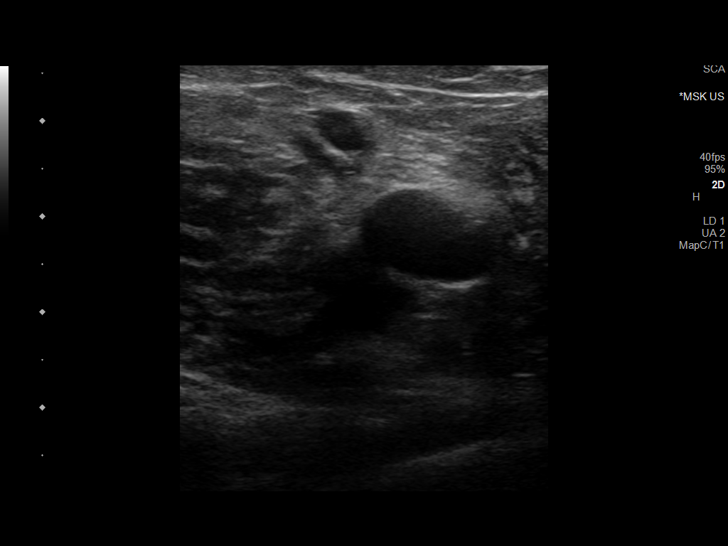
[im 2/17]
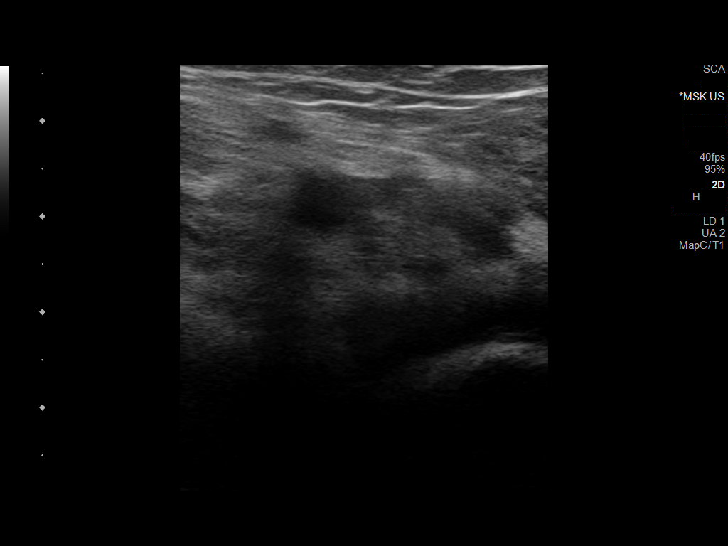
[im 4/17]
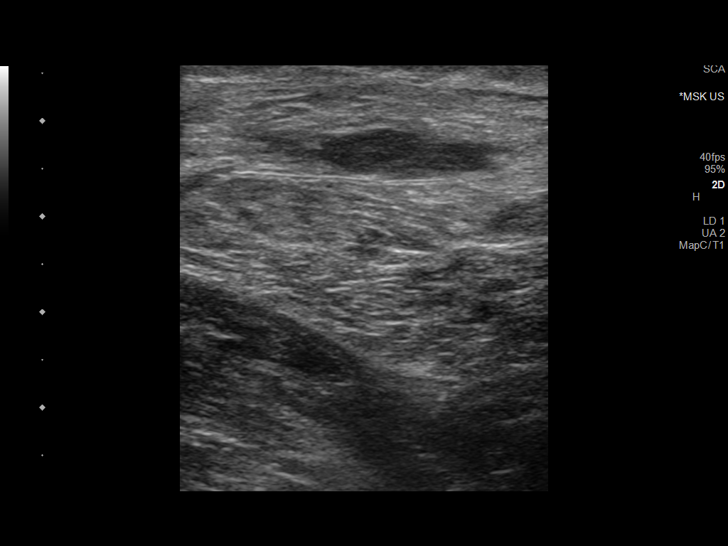
[im 5/17]
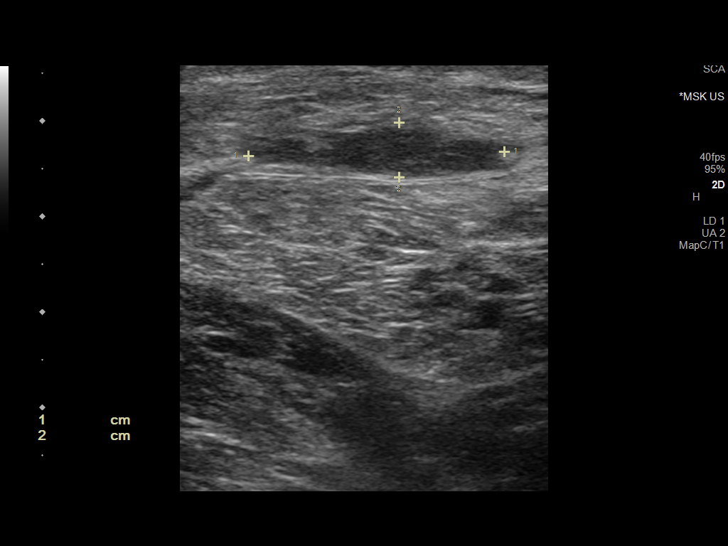
[im 6/17]
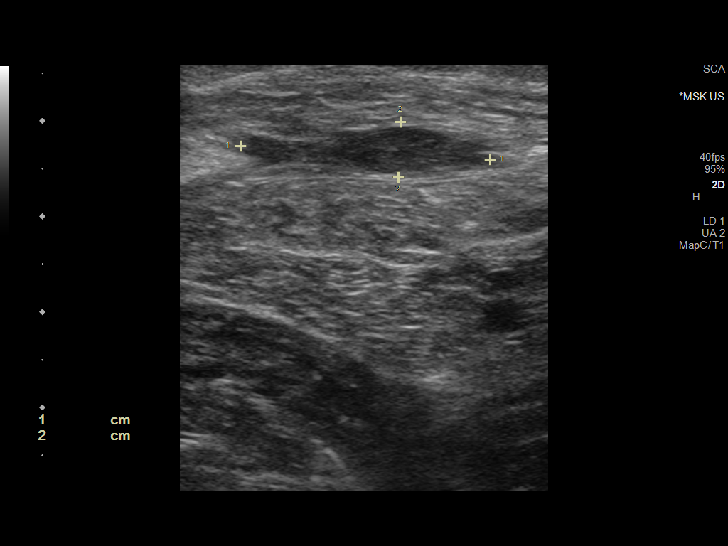
[im 7/17]
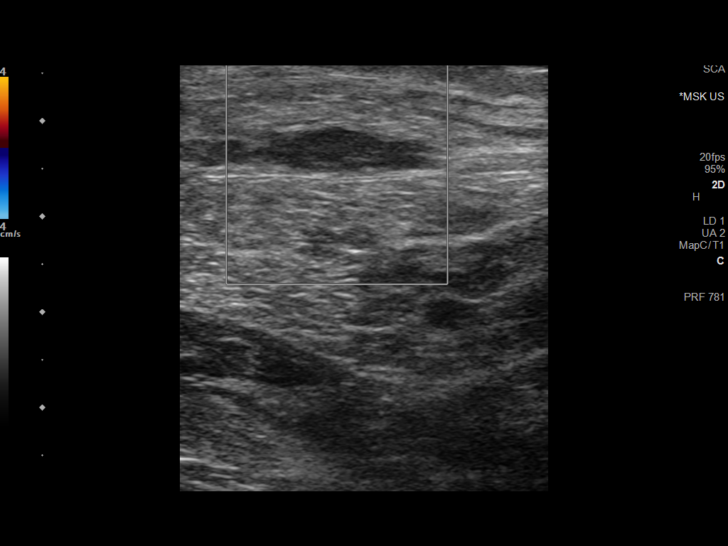
[im 8/17]
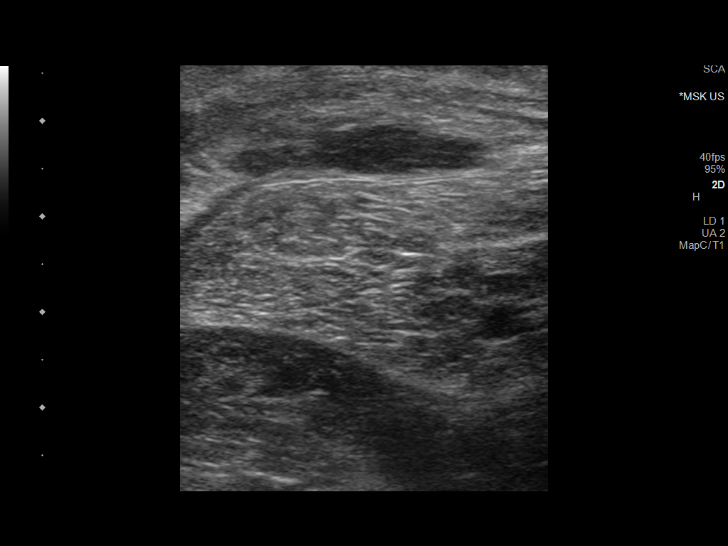
[im 10/17]
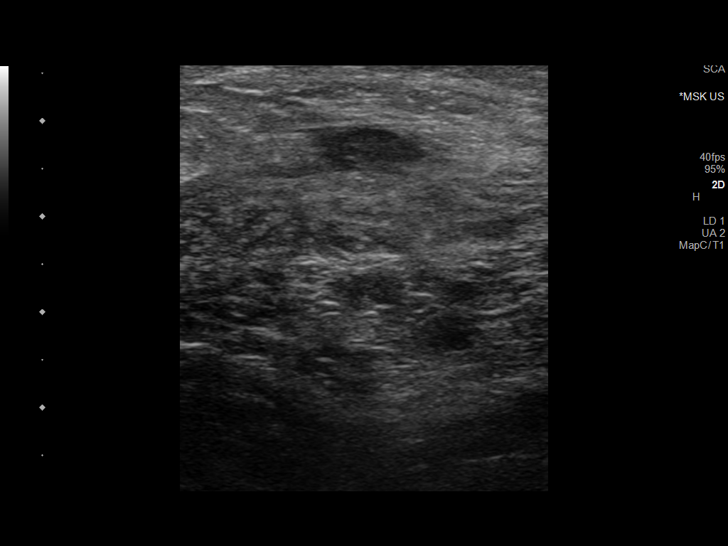
[im 11/17]
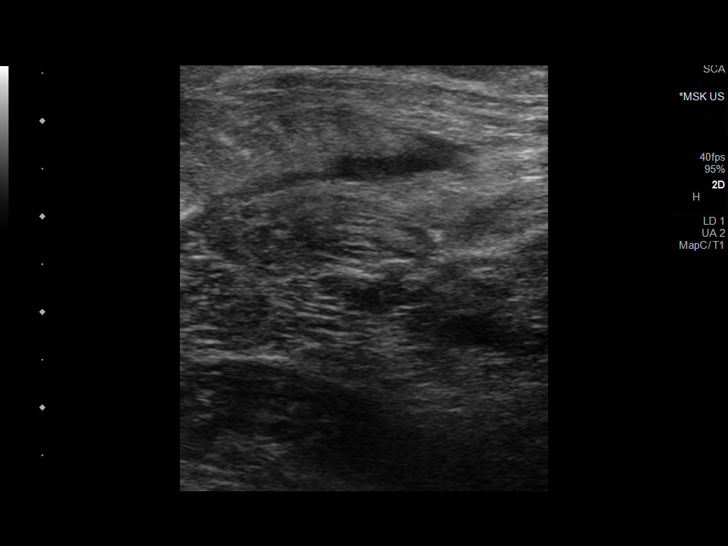
[im 12/17]
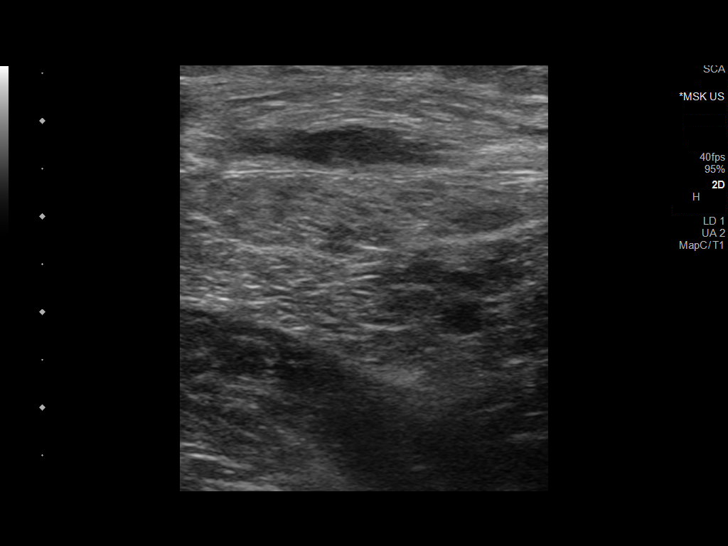
[im 13/17]
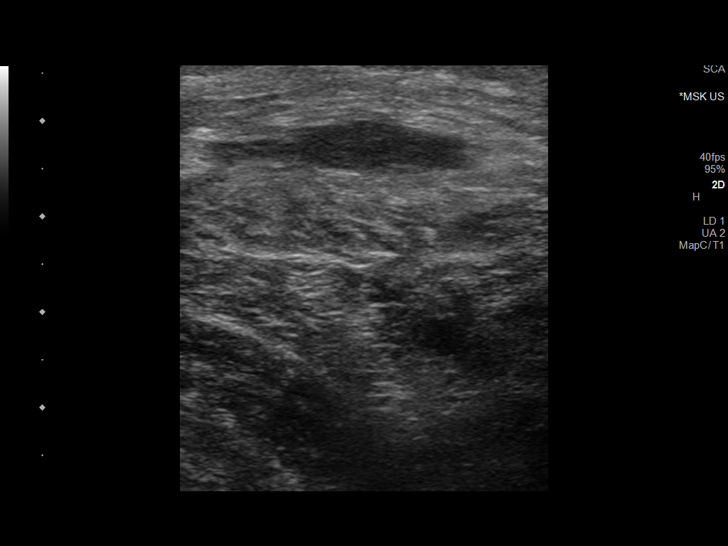
[im 14/17]
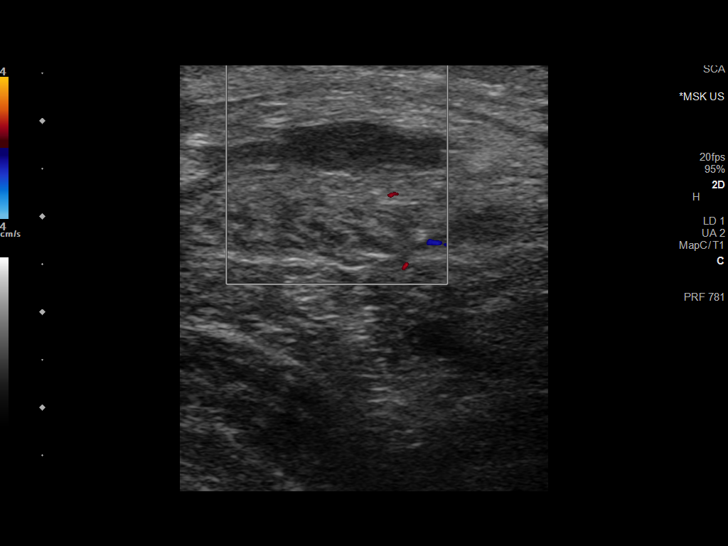
[im 16/17]
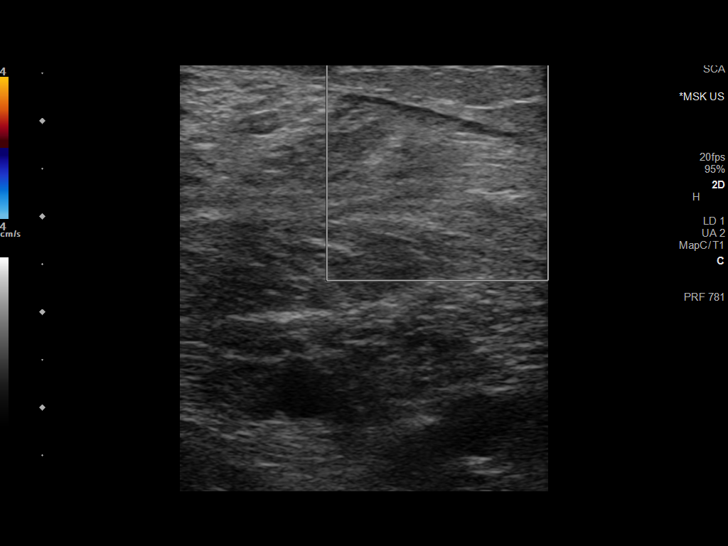
[im 17/17]
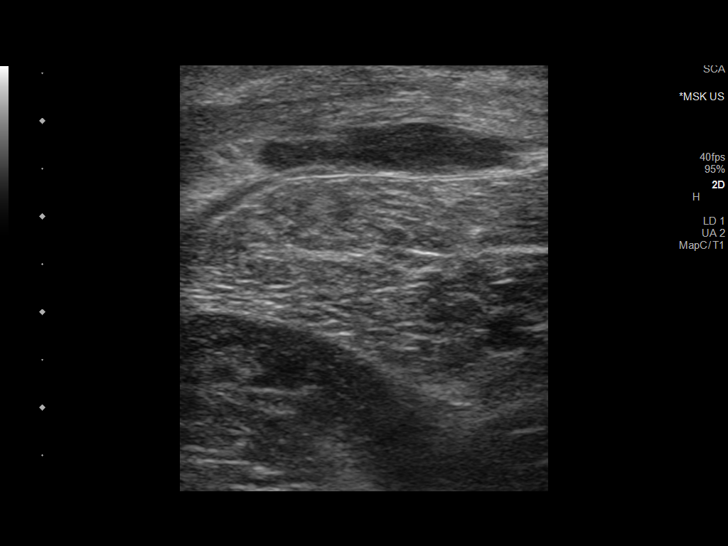

[14 of 17 positions shown; findings below may reference images not displayed]

FINDINGS: Targeted sonography of the mid calf is performed. Intramuscular
complex fluid collection at the mid calf measuring 2.7 x 0.6 x
cm.
IMPRESSION: 2.7 cm slightly complex intramuscular fluid collection at the mid
calf. Differential considerations include intramuscular hematoma
versus infectious or inflammatory fluid collection. If further
imaging is required, MRI should be obtained.

## 2022-11-07 ENCOUNTER — Encounter: Payer: Self-pay | Admitting: Family Medicine

## 2022-11-07 ENCOUNTER — Ambulatory Visit (INDEPENDENT_AMBULATORY_CARE_PROVIDER_SITE_OTHER): Payer: Self-pay | Admitting: Family Medicine

## 2022-11-07 VITALS — BP 119/80 | HR 61 | Temp 97.7°F | Ht 71.0 in | Wt 218.8 lb

## 2022-11-07 DIAGNOSIS — M7631 Iliotibial band syndrome, right leg: Secondary | ICD-10-CM

## 2022-11-07 DIAGNOSIS — D6851 Activated protein C resistance: Secondary | ICD-10-CM

## 2022-11-07 DIAGNOSIS — Z7901 Long term (current) use of anticoagulants: Secondary | ICD-10-CM

## 2022-11-07 DIAGNOSIS — S93492A Sprain of other ligament of left ankle, initial encounter: Secondary | ICD-10-CM

## 2022-11-07 LAB — COAGUCHEK XS/INR WAIVED
INR: 2 — ABNORMAL HIGH (ref 0.9–1.1)
Prothrombin Time: 23.8 s

## 2022-11-07 MED ORDER — WARFARIN SODIUM 5 MG PO TABS: ORAL_TABLET | ORAL | 0 refills | Status: DC

## 2022-11-07 MED ORDER — WARFARIN SODIUM 10 MG PO TABS: 10.0000 mg | ORAL_TABLET | Freq: Every day | ORAL | 0 refills | Status: DC

## 2022-11-07 NOTE — Progress Notes (Signed)
Subjective: CC: Right leg pain PCP: [name], DO [id number] Adam Landry is a 41 y.o. male presenting to clinic today for:  1.  Knee pain Patient reports that he has been having progressive knee pain.  He points along the lateral aspect of the right knee.  Yesterday it was extremely tender to touch but today he has not had any issues like that.  Does not use oral NSAIDs secondary to chronic anticoagulation.  He works a physical job with frequent walking, standing and bending.  2.  Left ankle pain Patient reports that he accidentally twisted his left ankle while blowing leaves.  He has subsequently had some pain and swelling along the lateral aspect of the left ankle and even noticed some bruising.  He has been using an old orthopedic boot that he had to stabilize that left ankle as well as using compression.  3.  Factor V Leyden mutation Patient is compliant with Coumadin 10 mg daily except for 50 mg on Tuesdays and Wednesdays.  No reports of bleeding but does report bruising of the left ankle as above   ROS: Per HPI  No Known Allergies Past Medical History:  Diagnosis Date   Clotting disorder (HCC)    Factor 5 Leiden mutation, heterozygous (HCC)    Hypercholesterolemia    Substance abuse (HCC)     Current Outpatient Medications:    warfarin (COUMADIN) 10 MG tablet, Take 1 tablet (10 mg total) by mouth daily., Disp: 90 tablet, Rfl: 0   warfarin (COUMADIN) 5 MG tablet, TAKE AS DIRECTED PER  CLINIC, Disp: 90 tablet, Rfl: 0   betamethasone valerate ointment (VALISONE) 0.1 %, Apply 1 application topically 2 (two) times daily. x10-14 days on KNEE (Patient not taking: Reported on 12/30/2021), Disp: 45 g, Rfl: 0   clotrimazole-betamethasone (LOTRISONE) cream, Apply 1 application topically 2 (two) times daily. To buttock x7-10 days (Patient not taking: Reported on 12/30/2021), Disp: 15 g, Rfl: 0 Social History   Socioeconomic History   Marital status: Single    Spouse name: Not  on file   Number of children: Not on file   Years of education: Not on file   Highest education level: Not on file  Occupational History   Not on file  Tobacco Use   Smoking status: Every Day    Packs/day: 1.00    Years: 15.00    Total pack years: 15.00    Types: Cigarettes   Smokeless tobacco: Never  Vaping Use   Vaping Use: Never used  Substance and Sexual Activity   Alcohol use: Not Currently    Comment: See SA Chart   Drug use: Not Currently    Types: Methamphetamines, Marijuana, Amphetamines    Comment: HX of    Sexual activity: Yes  Other Topics Concern   Not on file  Social History Narrative   Not on file   Social Determinants of Health   Financial Resource Strain: Not on file  Food Insecurity: Not on file  Transportation Needs: Not on file  Physical Activity: Not on file  Stress: Not on file  Social Connections: Not on file  Intimate Partner Violence: Not on file   Family History  Problem Relation Age of Onset   Hypertension Mother    Hyperlipidemia Mother    Hypertension Sister    Alcohol abuse Father     Objective: Office vital signs reviewed. Temp 97.7 F (36.5 C)   Ht 5\' 11"  (1.803 m)   Wt 218 lb 12.8  oz (99.2 kg)   BMI 30.52 kg/m   Physical Examination:  General: Awake, alert, well nourished, No acute distress HEENT: sclera white, MMM MSK: Antalgic gait  Left ankle: Moderate soft tissue swelling along the posterior lateral ankle.  He has no tenderness palpation over the distal malleolus but does have mild tenderness palpation along the ligament.  He has some scant bruising appreciated.  No appreciable deformity.  No pain with talar tilt.  Right knee: No tenderness palpation to patella, patellar tendon, quads tendon, lateral joint line.  Minimal tenderness to palpation along the medial joint line near the IT band insertion.  There are no palpable deformities.  No gross soft tissue swelling or joint effusion.  No warmth.  Range of motion is  intact  Assessment/ Plan: 41 y.o. male   Sprain of posterior talofibular ligament of left ankle, initial encounter - Plan: DG Ankle Complete Left  It band syndrome, right  Factor V Leiden, prothrombin gene mutation (HCC) - Plan: CoaguChek XS/INR Waived, warfarin (COUMADIN) 5 MG tablet, warfarin (COUMADIN) 10 MG tablet  Long term current use of anticoagulant therapy - Plan: CoaguChek XS/INR Waived, warfarin (COUMADIN) 5 MG tablet, warfarin (COUMADIN) 10 MG tablet  Left ankle consistent with posterior ankle sprain.  Continue boot.  Ice the affected area, elevate, compress.  Okay to use topical NSAID but avoid oral NSAIDs given chronic anticoagulation.  Home physical therapy provided  Suspect IT band syndrome on the right.  I have given him home physical therapy exercises to loosen this as well as recommended use of topical NSAID.  If no significant improvement, plan for referral  INR therapeutic at 2.0.  No changes.  Recommended consistent follow-up and we will plan to see each other back again in 2 months   Orders Placed This Encounter  Procedures   CoaguChek XS/INR Waived   No orders of the defined types were placed in this encounter.    Adam Norlander, DO [place] [phone]

## 2022-11-07 NOTE — Patient Instructions (Signed)
Voltaren gel to affected areas up to 4 times daily as needed for pain and inflammation Home physical therapy exercises as directed Ice that ankle. It's sprained   Ankle Sprain, Phase I Rehab An ankle sprain is an injury to the ligaments of your ankle. Ankle sprains cause stiffness, loss of motion, and loss of strength. Ask your health care provider which exercises are safe for you. Do exercises exactly as told by your health care provider and adjust them as directed. It is normal to feel mild stretching, pulling, tightness, or discomfort as you do these exercises. Stop right away if you feel sudden pain or your pain gets worse. Do not begin these exercises until told by your health care provider. Stretching and range-of-motion exercises These exercises warm up your muscles and joints and improve the movement and flexibility of your lower leg and ankle. These exercises also help to relieve pain and stiffness. Gastroc and soleus stretch This exercise is also called a calf stretch. It stretches the muscles in the back of the lower leg. These muscles are the gastrocnemius, or gastroc, and the soleus. Sit on the floor with your left / right leg extended. Loop a belt or towel around the ball of your left / right foot. The ball of your foot is on the walking surface, right under your toes. Keep your left / right ankle and foot relaxed and keep your knee straight while you use the belt or towel to pull your foot toward you. You should feel a gentle stretch behind your calf or knee in your gastroc muscle. Hold this position for __________ seconds, then release to the starting position. Repeat the exercise with your knee bent. You can put a pillow or a rolled bath towel under your knee to support it. You should feel a stretch deep in your calf in the soleus muscle or at your Achilles tendon. Repeat __________ times. Complete this exercise __________ times a day. Ankle alphabet  Sit with your left / right  leg supported at the lower leg. Do not rest your foot on anything. Make sure your foot has room to move freely. Think of your left / right foot as a paintbrush. Move your foot to trace each letter of the alphabet in the air. Keep your hip and knee still while you trace. Make the letters as large as you can without feeling discomfort. Trace every letter from A to Z. Repeat __________ times. Complete this exercise __________ times a day. Strengthening exercises These exercises build strength and endurance in your ankle and lower leg. Endurance is the ability to use your muscles for a long time, even after they get tired. Ankle dorsiflexion  Secure a rubber exercise band or tube to an object, such as a table leg, that will stay still when the band is pulled. Secure the other end around your left / right foot. Sit on the floor facing the object, with your left / right leg extended. The band or tube should be slightly tense when your foot is relaxed. Slowly bring your foot toward you, bringing the top of your foot toward your shin (dorsiflexion), and pulling the band tighter. Hold this position for __________ seconds. Slowly return your foot to the starting position. Repeat __________ times. Complete this exercise __________ times a day. Ankle plantar flexion  Sit on the floor with your left / right leg extended. Loop a rubber exercise tube or band around the ball of your left / right foot. The ball of  your foot is on the walking surface, right under your toes. Hold the ends of the band or tube in your hands. The band or tube should be slightly tense when your foot is relaxed. Slowly point your foot and toes downward to tilt the top of your foot away from your shin (plantar flexion). Hold this position for __________ seconds. Slowly return your foot to the starting position. Repeat __________ times. Complete this exercise __________ times a day. Ankle eversion Sit on the floor with your legs  straight out in front of you. Loop a rubber exercise band or tube around the ball of your left / right foot. The ball of your foot is on the walking surface, right under your toes. Hold the ends of the band in your hands, or secure the band to a stable object. The band or tube should be slightly tense when your foot is relaxed. Slowly push your foot outward, away from your other leg (eversion). Hold this position for __________ seconds. Slowly return your foot to the starting position. Repeat __________ times. Complete this exercise __________ times a day. This information is not intended to replace advice given to you by your health care provider. Make sure you discuss any questions you have with your health care provider. Document Revised: 01/29/2021 Document Reviewed: 01/31/2021 Elsevier Patient Education  2023 [employer].

## 2023-01-07 ENCOUNTER — Encounter: Payer: Self-pay | Admitting: Family Medicine

## 2023-01-07 ENCOUNTER — Ambulatory Visit (INDEPENDENT_AMBULATORY_CARE_PROVIDER_SITE_OTHER): Payer: Self-pay | Admitting: Family Medicine

## 2023-01-07 VITALS — BP 115/72 | HR 76 | Temp 97.7°F | Ht 71.0 in | Wt 218.8 lb

## 2023-01-07 DIAGNOSIS — D6851 Activated protein C resistance: Secondary | ICD-10-CM

## 2023-01-07 DIAGNOSIS — Z7901 Long term (current) use of anticoagulants: Secondary | ICD-10-CM

## 2023-01-07 MED ORDER — WARFARIN SODIUM 10 MG PO TABS: 10.0000 mg | ORAL_TABLET | Freq: Every day | ORAL | 0 refills | Status: DC

## 2023-01-07 NOTE — Progress Notes (Signed)
Subjective: CC:INR check PCP: Janora Norlander, DO Adam Landry is a 42 y.o. male presenting to clinic today for:  1. Factor V leiden mutation Compliant with coumadin 15 mg Tues/Wed and 10mg  daily all other days. Using a pill box to ensure no missed doses. No reports of bleeding.   ROS: Per HPI  No Known Allergies Past Medical History:  Diagnosis Date   Clotting disorder ([place])    Factor 5 Leiden mutation, heterozygous ([place])    Hypercholesterolemia    Substance abuse ([place])     Current Outpatient Medications:    betamethasone valerate ointment (VALISONE) 0.1 %, Apply 1 application topically 2 (two) times daily. x10-14 days on KNEE (Patient not taking: Reported on 12/30/2021), Disp: 45 g, Rfl: 0   clotrimazole-betamethasone (LOTRISONE) cream, Apply 1 application topically 2 (two) times daily. To buttock x7-10 days (Patient not taking: Reported on 12/30/2021), Disp: 15 g, Rfl: 0   warfarin (COUMADIN) 10 MG tablet, Take 1 tablet (10 mg total) by mouth daily., Disp: 90 tablet, Rfl: 0   warfarin (COUMADIN) 5 MG tablet, TAKE AS DIRECTED PER  CLINIC, Disp: 90 tablet, Rfl: 0 Social History   Socioeconomic History   Marital status: Single    Spouse name: Not on file   Number of children: Not on file   Years of education: Not on file   Highest education level: Not on file  Occupational History   Not on file  Tobacco Use   Smoking status: Every Day    Packs/day: 1.00    Years: 15.00    Total pack years: 15.00    Types: Cigarettes   Smokeless tobacco: Never  Vaping Use   Vaping Use: Never used  Substance and Sexual Activity   Alcohol use: Not Currently    Comment: See SA Chart   Drug use: Not Currently    Types: Methamphetamines, Marijuana, Amphetamines    Comment: HX of    Sexual activity: Yes  Other Topics Concern   Not on file  Social History Narrative   Not on file   Social Determinants of Health   Financial Resource Strain: Not on file  Food Insecurity:  Not on file  Transportation Needs: Not on file  Physical Activity: Not on file  Stress: Not on file  Social Connections: Not on file  Intimate Partner Violence: Not on file   Family History  Problem Relation Age of Onset   Hypertension Mother    Hyperlipidemia Mother    Hypertension Sister    Alcohol abuse Father     Objective: Office vital signs reviewed. BP 115/72   Pulse 76   Temp 97.7 F (36.5 C)   Ht 5\' 11"  (1.803 m)   Wt 218 lb 12.8 oz (99.2 kg)   SpO2 95%   BMI 30.52 kg/m   Physical Examination:  General: Awake, alert, well nourished, No acute distress HEENT: No conjunctival pallor Cardio: regular rate and rhythm  Pulm: normal WOB on room air  Assessment/ Plan: 42 y.o. male   Factor V Leiden, prothrombin gene mutation ([place]) - Plan: CoaguChek XS/INR Waived, warfarin (COUMADIN) 10 MG tablet  Long term current use of anticoagulant therapy - Plan: warfarin (COUMADIN) 10 MG tablet   INR therapeutic at 2.6. continue current regimen. Follow up in 2 months.  No orders of the defined types were placed in this encounter.  No orders of the defined types were placed in this encounter.    Janora Norlander, DO [facility] [phone]

## 2023-01-08 LAB — COAGUCHEK XS/INR WAIVED
INR: 2.6 — ABNORMAL HIGH (ref 0.9–1.1)
Prothrombin Time: 31.5 s

## 2023-03-13 ENCOUNTER — Encounter: Payer: Self-pay | Admitting: Family Medicine

## 2023-03-13 ENCOUNTER — Ambulatory Visit (INDEPENDENT_AMBULATORY_CARE_PROVIDER_SITE_OTHER): Payer: Self-pay | Admitting: Family Medicine

## 2023-03-13 VITALS — BP 128/86 | HR 68 | Temp 98.8°F | Ht 71.0 in | Wt 219.0 lb

## 2023-03-13 DIAGNOSIS — D6851 Activated protein C resistance: Secondary | ICD-10-CM

## 2023-03-13 DIAGNOSIS — Z7901 Long term (current) use of anticoagulants: Secondary | ICD-10-CM

## 2023-03-13 LAB — HEMOGLOBIN, FINGERSTICK: Hemoglobin: 15.5 g/dL (ref 12.6–17.7)

## 2023-03-13 LAB — COAGUCHEK XS/INR WAIVED
INR: 2 — ABNORMAL HIGH (ref 0.9–1.1)
Prothrombin Time: 23.6 s

## 2023-03-13 MED ORDER — WARFARIN SODIUM 5 MG PO TABS: ORAL_TABLET | ORAL | 0 refills | Status: DC

## 2023-03-13 MED ORDER — WARFARIN SODIUM 10 MG PO TABS: 10.0000 mg | ORAL_TABLET | Freq: Every day | ORAL | 0 refills | Status: DC
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
## 2023-06-01 ENCOUNTER — Ambulatory Visit: Payer: Self-pay | Admitting: Family Medicine

## 2023-06-01 ENCOUNTER — Encounter: Payer: Self-pay | Admitting: Family Medicine

## 2023-06-01 VITALS — BP 140/83 | HR 79 | Temp 98.6°F | Ht 71.0 in | Wt 214.0 lb

## 2023-06-01 DIAGNOSIS — D6851 Activated protein C resistance: Secondary | ICD-10-CM

## 2023-06-01 DIAGNOSIS — Z7901 Long term (current) use of anticoagulants: Secondary | ICD-10-CM

## 2023-06-01 LAB — COAGUCHEK XS/INR WAIVED
INR: 3 — ABNORMAL HIGH (ref 0.9–1.1)
Prothrombin Time: 35.8 s

## 2023-06-01 MED ORDER — WARFARIN SODIUM 10 MG PO TABS: 10.0000 mg | ORAL_TABLET | Freq: Every day | ORAL | 0 refills | Status: DC

## 2023-06-01 MED ORDER — WARFARIN SODIUM 5 MG PO TABS: ORAL_TABLET | ORAL | 0 refills | Status: DC
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
## 2023-06-29 ENCOUNTER — Encounter: Payer: Self-pay | Admitting: Family Medicine

## 2023-07-23 ENCOUNTER — Ambulatory Visit: Payer: Self-pay | Admitting: Nurse Practitioner

## 2023-07-23 ENCOUNTER — Encounter: Payer: Self-pay | Admitting: Nurse Practitioner

## 2023-07-23 VITALS — BP 113/77 | HR 60 | Temp 97.8°F | Ht 71.0 in | Wt 209.8 lb

## 2023-07-23 DIAGNOSIS — H1031 Unspecified acute conjunctivitis, right eye: Secondary | ICD-10-CM

## 2023-07-23 MED ORDER — DOUBLE ANTIBIOTIC 500-10000 UNIT/GM EX OINT
1.0000 | TOPICAL_OINTMENT | Freq: Two times a day (BID) | CUTANEOUS | 0 refills | Status: DC
Start: 2023-07-23 — End: 2023-07-24

## 2023-07-23 NOTE — Progress Notes (Signed)
   Acute Office Visit  Subjective:     Patient ID: Adam Landry, male    DOB: 19-May-1981, 42 y.o.   MRN: [id number]  Chief Complaint  Patient presents with   Eye Pain    Right eye red, swollen, full of pus. Has been like this for 1 day. Pt wears contacts but states he cleans them and takes them out daily.     HPI Adam Landry 42 y.o. male seen today as an acute visit c/o with burning, redness, discharge and mattering in right eye for 1 days.  No other symptoms.  No significant prior ophthalmological history. No change in visual acuity, no photophobia, no severe eye pain.   Review of Systems  Constitutional:  Negative for chills and fever.  Eyes:  Positive for discharge and redness. Negative for blurred vision, double vision and photophobia.  Respiratory:  Negative for cough and shortness of breath.   Cardiovascular:  Negative for chest pain and leg swelling.  Skin:  Negative for itching and rash.  Neurological:  Negative for dizziness and headaches.   Negative unless indicated in HPI    Objective:    BP 113/77   Pulse 60   Temp 97.8 F (36.6 C) (Temporal)   Ht 5\' 11"  (1.803 m)   Wt 209 lb 12.8 oz (95.2 kg)   SpO2 96%   BMI 29.26 kg/m  BP Readings from Last 3 Encounters:  07/23/23 113/77  06/01/23 (!) 140/83  03/13/23 128/86   Wt Readings from Last 3 Encounters:  07/23/23 209 lb 12.8 oz (95.2 kg)  06/01/23 214 lb (97.1 kg)  03/13/23 219 lb (99.3 kg)     Objective Patient appears well, vitals signs are normal. Eyes: right eye with findings of typical conjunctivitis noted; erythema and discharge. PERRLA, no foreign body noted. No periorbital cellulitis. The corneas are clear and fundi normal. Visual acuity normal.  No results found for any visits on 07/23/23.      Assessment & Plan:  Acute bacterial conjunctivitis of right eye -     Double Antibiotic; Apply 1 Application topically 2 (two) times daily.  Dispense: 28 g; Refill: 0    Adam Landry was seen today for bacterial conjunctivitis, no acute distress  PLAN:  Antibiotic drops per order. Hygiene discussed. If other family members develop same condition, may use same medication for them if they are not known to be allergic to it. Call prn.  Excuse note for work provided Return for follow up  as alredy scheduled.  Arrie Aran Santa Lighter, DNP [facility] [address] [place], [place] [address] [phone]

## 2023-07-24 ENCOUNTER — Telehealth: Payer: Self-pay | Admitting: Family Medicine

## 2023-07-24 DIAGNOSIS — H1031 Unspecified acute conjunctivitis, right eye: Secondary | ICD-10-CM

## 2023-07-24 MED ORDER — POLYMYXIN B-TRIMETHOPRIM 10000-0.1 UNIT/ML-% OP SOLN
1.0000 [drp] | Freq: Four times a day (QID) | OPHTHALMIC | 0 refills | Status: AC
Start: 2023-07-24 — End: 2023-07-31

## 2023-07-24 NOTE — Telephone Encounter (Signed)
Pt states that the prescription states to not put the ointment in the eye. Instructions given on how to use but pt would prefer a drop instead.  Will send to [facility] to make sure drops are appropriate.

## 2023-07-24 NOTE — Telephone Encounter (Signed)
Pt made aware

## 2023-07-24 NOTE — Addendum Note (Signed)
Addended by: Dorene Sorrow on: 07/24/2023 10:25 AM   Modules accepted: Orders

## 2023-07-24 NOTE — Telephone Encounter (Signed)
Think that was a clerical error.  I've sent Polytrim eye DROPS to the pharmacy for him.  Please see if pharmacy will take back what he was given and refund him.

## 2023-07-27 ENCOUNTER — Encounter: Payer: Self-pay | Admitting: Family Medicine

## 2023-07-27 ENCOUNTER — Ambulatory Visit (INDEPENDENT_AMBULATORY_CARE_PROVIDER_SITE_OTHER): Payer: Self-pay | Admitting: Family Medicine

## 2023-07-27 VITALS — BP 127/82 | HR 60 | Temp 98.7°F | Ht 71.0 in | Wt 212.0 lb

## 2023-07-27 DIAGNOSIS — D6851 Activated protein C resistance: Secondary | ICD-10-CM

## 2023-07-27 DIAGNOSIS — Z7901 Long term (current) use of anticoagulants: Secondary | ICD-10-CM

## 2023-07-27 LAB — COAGUCHEK XS/INR WAIVED
INR: 2.7 — ABNORMAL HIGH (ref 0.9–1.1)
Prothrombin Time: 32.8 s

## 2023-07-27 MED ORDER — WARFARIN SODIUM 10 MG PO TABS: 10.0000 mg | ORAL_TABLET | Freq: Every day | ORAL | 0 refills | Status: DC

## 2023-07-27 MED ORDER — WARFARIN SODIUM 5 MG PO TABS: ORAL_TABLET | ORAL | 0 refills | Status: DC
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
## 2023-09-16 ENCOUNTER — Ambulatory Visit: Payer: Self-pay | Admitting: Family Medicine

## 2023-11-18 ENCOUNTER — Encounter: Payer: Self-pay | Admitting: Family Medicine

## 2023-11-18 ENCOUNTER — Ambulatory Visit (INDEPENDENT_AMBULATORY_CARE_PROVIDER_SITE_OTHER): Payer: Self-pay | Admitting: Family Medicine

## 2023-11-18 VITALS — BP 137/89 | HR 67 | Temp 98.3°F | Ht 71.0 in | Wt 216.0 lb

## 2023-11-18 DIAGNOSIS — D6851 Activated protein C resistance: Secondary | ICD-10-CM

## 2023-11-18 DIAGNOSIS — Z7901 Long term (current) use of anticoagulants: Secondary | ICD-10-CM

## 2023-11-18 DIAGNOSIS — Z2989 Encounter for other specified prophylactic measures: Secondary | ICD-10-CM

## 2023-11-18 LAB — COAGUCHEK XS/INR WAIVED
INR: 2.3 — ABNORMAL HIGH (ref 0.9–1.1)
Prothrombin Time: 28.1 s

## 2023-11-18 MED ORDER — ATOVAQUONE-PROGUANIL HCL 250-100 MG PO TABS
1.0000 | ORAL_TABLET | Freq: Every day | ORAL | 0 refills | Status: DC
Start: 2023-11-18 — End: 2024-10-11

## 2023-11-18 MED ORDER — WARFARIN SODIUM 10 MG PO TABS: 10.0000 mg | ORAL_TABLET | Freq: Every day | ORAL | 0 refills | Status: DC

## 2023-11-18 MED ORDER — WARFARIN SODIUM 5 MG PO TABS
ORAL_TABLET | ORAL | 0 refills | Status: DC
Start: 2023-11-18 — End: 2024-02-12
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
## 2024-02-09 ENCOUNTER — Telehealth: Payer: Self-pay

## 2024-02-09 ENCOUNTER — Telehealth: Payer: Self-pay | Admitting: Family Medicine

## 2024-02-09 NOTE — Telephone Encounter (Signed)
 There is already an open phone visit about this - refer to other phone visit
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
## 2024-02-09 NOTE — Telephone Encounter (Signed)
 See if he wants to come in Friday afternoon
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
## 2024-02-09 NOTE — Telephone Encounter (Signed)
 Adressed in another encounter. LS

## 2024-02-10 ENCOUNTER — Ambulatory Visit: Payer: Self-pay | Admitting: Family Medicine

## 2024-02-12 ENCOUNTER — Ambulatory Visit (INDEPENDENT_AMBULATORY_CARE_PROVIDER_SITE_OTHER): Payer: Self-pay | Admitting: Family Medicine

## 2024-02-12 ENCOUNTER — Encounter: Payer: Self-pay | Admitting: Family Medicine

## 2024-02-12 VITALS — BP 123/87 | HR 66 | Temp 98.7°F | Ht 71.0 in | Wt 212.0 lb

## 2024-02-12 DIAGNOSIS — D6851 Activated protein C resistance: Secondary | ICD-10-CM

## 2024-02-12 DIAGNOSIS — Z7901 Long term (current) use of anticoagulants: Secondary | ICD-10-CM

## 2024-02-12 LAB — COAGUCHEK XS/INR WAIVED
INR: 2.6 — ABNORMAL HIGH (ref 0.9–1.1)
Prothrombin Time: 31.5 s

## 2024-02-12 MED ORDER — WARFARIN SODIUM 5 MG PO TABS: ORAL_TABLET | ORAL | 0 refills | Status: DC

## 2024-02-12 MED ORDER — WARFARIN SODIUM 10 MG PO TABS: 10.0000 mg | ORAL_TABLET | Freq: Every day | ORAL | 0 refills | Status: DC
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
## 2024-03-01 ENCOUNTER — Ambulatory Visit (INDEPENDENT_AMBULATORY_CARE_PROVIDER_SITE_OTHER): Payer: Self-pay | Admitting: Family Medicine

## 2024-03-01 ENCOUNTER — Encounter: Payer: Self-pay | Admitting: Family Medicine

## 2024-03-01 ENCOUNTER — Ambulatory Visit: Payer: Self-pay | Admitting: Family Medicine

## 2024-03-01 VITALS — BP 119/76 | HR 61 | Temp 97.7°F | Ht 71.0 in | Wt 211.8 lb

## 2024-03-01 DIAGNOSIS — J0140 Acute pansinusitis, unspecified: Secondary | ICD-10-CM

## 2024-03-01 MED ORDER — AMOXICILLIN-POT CLAVULANATE 875-125 MG PO TABS: 1.0000 | ORAL_TABLET | Freq: Two times a day (BID) | ORAL | 0 refills | Status: AC

## 2024-03-01 MED ORDER — FLUTICASONE PROPIONATE 50 MCG/ACT NA SUSP: 2.0000 | Freq: Every day | NASAL | 6 refills | Status: DC
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
## 2024-03-01 NOTE — Telephone Encounter (Signed)
  Chief Complaint:  Wet cough with green phlegm with black specks and neon green sinus drainage with blood. Chest pain from cough. Symptoms: above Frequency: 02/19/2024 Pertinent Negatives: Patient denies fever Disposition: [] ED /[] Urgent Care (no appt availability in office) / [x] Appointment(In office/virtual)/ []  [facility]/ [] Home Care/ [] Refused Recommended Disposition /[] [facility]/ []  Follow-up with PCP Additional Notes: Pt was recently in [place]. He developed a cough 02/19/2024,which has gotten worse. Pt states that his lungs hurt from cough.  Appt for this morning scheduled.    Copied from CRM [id number]. Topic: Clinical - Red Word Triage >> Mar 01, 2024  8:27 AM Dyann Kief wrote: [facility] that prompted transfer to Nurse Triage: Chest pain, Increased coughing. Reason for Disposition  SEVERE coughing spells (e.g., whooping sound after coughing, vomiting after coughing)  Answer Assessment - Initial Assessment Questions 1. ONSET: "When did the cough begin?"      Feb 28 2. SEVERITY: "How bad is the cough today?"      sporadic 3. SPUTUM: "Describe the color of your sputum" (none, dry cough; clear, white, yellow, green)     Green with back spots 4. HEMOPTYSIS: "Are you coughing up any blood?" If so ask: "How much?" (flecks, streaks, tablespoons, etc.)     no 5. DIFFICULTY BREATHING: "Are you having difficulty breathing?" If Yes, ask: "How bad is it?" (e.g., mild, moderate, severe)    - MILD: No SOB at rest, mild SOB with walking, speaks normally in sentences, can lie down, no retractions, pulse < 100.    - MODERATE: SOB at rest, SOB with minimal exertion and prefers to sit, cannot lie down flat, speaks in phrases, mild retractions, audible wheezing, pulse 100-120.    - SEVERE: Very SOB at rest, speaks in single words, struggling to breathe, sitting hunched forward, retractions, pulse > 120      moderate 6. FEVER: "Do you have a fever?" If Yes, ask: "What is your  temperature, how was it measured, and when did it start?"     no 7. CARDIAC HISTORY: "Do you have any history of heart disease?" (e.g., heart attack, congestive heart failure)      no 8. LUNG HISTORY: "Do you have any history of lung disease?"  (e.g., pulmonary embolus, asthma, emphysema)     no 10. OTHER SYMPTOMS: "Do you have any other symptoms?" (e.g., runny nose, wheezing, chest pain)       Neon green with Red in sinus discharge 11. PREGNANCY: "Is there any chance you are pregnant?" "When was your last menstrual period?"       *No Answer* 12. TRAVEL: "Have you traveled out of the country in the last month?" (e.g., travel history, exposures)       1 week in [place], 2 days in [place]  Protocols used: Cough - Acute Productive-A-AH

## 2024-05-13 ENCOUNTER — Ambulatory Visit (INDEPENDENT_AMBULATORY_CARE_PROVIDER_SITE_OTHER): Payer: Self-pay | Admitting: Family Medicine

## 2024-05-13 ENCOUNTER — Encounter: Payer: Self-pay | Admitting: Family Medicine

## 2024-05-13 VITALS — BP 113/75 | HR 73 | Temp 98.8°F | Ht 71.0 in | Wt 213.4 lb

## 2024-05-13 DIAGNOSIS — L409 Psoriasis, unspecified: Secondary | ICD-10-CM

## 2024-05-13 DIAGNOSIS — Z7901 Long term (current) use of anticoagulants: Secondary | ICD-10-CM

## 2024-05-13 DIAGNOSIS — D6851 Activated protein C resistance: Secondary | ICD-10-CM

## 2024-05-13 LAB — COAGUCHEK XS/INR WAIVED
INR: 2.9 — ABNORMAL HIGH (ref 0.9–1.1)
Prothrombin Time: 34.4 s
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
## 2024-05-14 LAB — CMP14+EGFR
ALT: 31 IU/L (ref 0–44)
AST: 26 IU/L (ref 0–40)
Albumin: 4.5 g/dL (ref 4.1–5.1)
Alkaline Phosphatase: 68 IU/L (ref 44–121)
BUN/Creatinine Ratio: 21 — ABNORMAL HIGH (ref 9–20)
BUN: 20 mg/dL (ref 6–24)
Bilirubin Total: 0.2 mg/dL (ref 0.0–1.2)
CO2: 17 mmol/L — ABNORMAL LOW (ref 20–29)
Calcium: 9.8 mg/dL (ref 8.7–10.2)
Chloride: 105 mmol/L (ref 96–106)
Creatinine, Ser: 0.95 mg/dL (ref 0.76–1.27)
Globulin, Total: 2.5 g/dL (ref 1.5–4.5)
Glucose: 87 mg/dL (ref 70–99)
Potassium: 4.5 mmol/L (ref 3.5–5.2)
Sodium: 139 mmol/L (ref 134–144)
Total Protein: 7 g/dL (ref 6.0–8.5)
eGFR: 102 mL/min/{1.73_m2} (ref >59)

## 2024-05-14 LAB — CBC
Hematocrit: 46.9 % (ref 37.5–51.0)
Hemoglobin: 15.8 g/dL (ref 13.0–17.7)
MCH: 31.6 pg (ref 26.6–33.0)
MCHC: 33.7 g/dL (ref 31.5–35.7)
MCV: 94 fL (ref 79–97)
Platelets: 238 10*3/uL (ref 150–450)
RBC: 5 x10E6/uL (ref 4.14–5.80)
RDW: 13.1 % (ref 11.6–15.4)
WBC: 7.6 10*3/uL (ref 3.4–10.8)

## 2024-05-17 ENCOUNTER — Ambulatory Visit: Payer: Self-pay | Admitting: Family Medicine

## 2024-07-07 ENCOUNTER — Ambulatory Visit: Payer: Self-pay | Admitting: Family Medicine

## 2024-07-07 ENCOUNTER — Telehealth: Payer: Self-pay | Admitting: Family Medicine

## 2024-07-07 NOTE — Telephone Encounter (Signed)
 pt ntbs by PCP for this note. lmtcb to schedule with PCP

## 2024-07-08 ENCOUNTER — Encounter: Payer: Self-pay | Admitting: Advanced Practice Midwife

## 2024-07-08 ENCOUNTER — Encounter: Payer: Self-pay | Admitting: Family Medicine

## 2024-07-11 ENCOUNTER — Encounter: Payer: Self-pay | Admitting: Family Medicine

## 2024-07-11 ENCOUNTER — Ambulatory Visit: Payer: Self-pay | Admitting: Family Medicine

## 2024-07-11 VITALS — BP 117/74 | HR 79 | Temp 99.8°F | Ht 71.0 in | Wt 220.0 lb

## 2024-07-11 DIAGNOSIS — D6851 Activated protein C resistance: Secondary | ICD-10-CM

## 2024-07-11 DIAGNOSIS — Z7901 Long term (current) use of anticoagulants: Secondary | ICD-10-CM

## 2024-07-11 DIAGNOSIS — Z7689 Persons encountering health services in other specified circumstances: Secondary | ICD-10-CM

## 2024-07-11 LAB — COAGUCHEK XS/INR WAIVED
INR: 2.4 — ABNORMAL HIGH (ref 0.9–1.1)
Prothrombin Time: 28.5 s
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
## 2024-08-02 ENCOUNTER — Ambulatory Visit: Payer: Self-pay | Admitting: Family Medicine

## 2024-08-12 ENCOUNTER — Ambulatory Visit: Payer: Self-pay | Admitting: Family Medicine

## 2024-10-11 ENCOUNTER — Encounter: Payer: Self-pay | Admitting: Family Medicine

## 2024-10-11 ENCOUNTER — Ambulatory Visit (INDEPENDENT_AMBULATORY_CARE_PROVIDER_SITE_OTHER): Payer: Self-pay | Admitting: Family Medicine

## 2024-10-11 VITALS — BP 134/85 | HR 65 | Temp 98.4°F | Ht 71.0 in | Wt 218.4 lb

## 2024-10-11 DIAGNOSIS — D6851 Activated protein C resistance: Secondary | ICD-10-CM

## 2024-10-11 DIAGNOSIS — Z7901 Long term (current) use of anticoagulants: Secondary | ICD-10-CM

## 2024-10-11 LAB — COAGUCHEK XS/INR WAIVED
INR: 2.1 — ABNORMAL HIGH (ref 0.9–1.1)
Prothrombin Time: 25.7 s

## 2024-10-11 MED ORDER — WARFARIN SODIUM 10 MG PO TABS: 10.0000 mg | ORAL_TABLET | Freq: Every day | ORAL | 0 refills | Status: AC

## 2024-10-11 MED ORDER — WARFARIN SODIUM 5 MG PO TABS: ORAL_TABLET | ORAL | 0 refills | Status: AC
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
## 2025-01-17 ENCOUNTER — Ambulatory Visit: Payer: Self-pay | Admitting: Family Medicine

## 2025-01-19 ENCOUNTER — Encounter: Payer: Self-pay | Admitting: Family Medicine

## 2025-01-19 ENCOUNTER — Ambulatory Visit: Payer: Self-pay | Admitting: Family Medicine

## 2025-01-19 VITALS — BP 117/74 | HR 75 | Temp 97.7°F | Ht 71.0 in | Wt 228.6 lb

## 2025-01-19 DIAGNOSIS — Z7901 Long term (current) use of anticoagulants: Secondary | ICD-10-CM

## 2025-01-19 DIAGNOSIS — R791 Abnormal coagulation profile: Secondary | ICD-10-CM

## 2025-01-19 DIAGNOSIS — D6851 Activated protein C resistance: Secondary | ICD-10-CM | POA: Diagnosis not present

## 2025-01-19 LAB — COAGUCHEK XS/INR WAIVED
INR: 3.8 — ABNORMAL HIGH (ref 0.9–1.1)
Prothrombin Time: 46 s
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
## 2025-02-06 ENCOUNTER — Ambulatory Visit: Payer: Self-pay | Admitting: Physician Assistant

## 2025-02-10 ENCOUNTER — Ambulatory Visit
